# Patient Record
Sex: Female | Born: 1990 | Race: Black or African American | Hispanic: No | Marital: Married | Smoking: Former smoker
Health system: Southern US, Community
[De-identification: ages and names within clinical notes are randomized; demographics above are authoritative.]

## PROBLEM LIST (undated history)

## (undated) DIAGNOSIS — Z5189 Encounter for other specified aftercare: Secondary | ICD-10-CM

## (undated) DIAGNOSIS — Q8740 Marfan's syndrome, unspecified: Secondary | ICD-10-CM

## (undated) DIAGNOSIS — D560 Alpha thalassemia: Secondary | ICD-10-CM

## (undated) DIAGNOSIS — D6859 Other primary thrombophilia: Secondary | ICD-10-CM

## (undated) DIAGNOSIS — D571 Sickle-cell disease without crisis: Secondary | ICD-10-CM

## (undated) DIAGNOSIS — I719 Aortic aneurysm of unspecified site, without rupture: Secondary | ICD-10-CM

## (undated) DIAGNOSIS — I509 Heart failure, unspecified: Secondary | ICD-10-CM

## (undated) DIAGNOSIS — R569 Unspecified convulsions: Secondary | ICD-10-CM

## (undated) HISTORY — PX: FOOT SURGERY: SHX648

## (undated) HISTORY — PX: EXPLORATION POST OPERATIVE OPEN HEART: SHX5061

## (undated) HISTORY — PX: NECK SURGERY: SHX720

---

## 2019-10-22 ENCOUNTER — Emergency Department (HOSPITAL_COMMUNITY): Payer: Medicaid Other

## 2019-10-22 ENCOUNTER — Encounter (HOSPITAL_COMMUNITY): Payer: Self-pay | Admitting: Obstetrics and Gynecology

## 2019-10-22 ENCOUNTER — Other Ambulatory Visit: Payer: Self-pay

## 2019-10-22 ENCOUNTER — Emergency Department (HOSPITAL_COMMUNITY)
Admission: AD | Admit: 2019-10-22 | Discharge: 2019-10-22 | Disposition: A | Discharge status: Home / Self Care | Payer: Medicaid Other | Attending: Emergency Medicine | Admitting: Emergency Medicine

## 2019-10-22 DIAGNOSIS — B9689 Other specified bacterial agents as the cause of diseases classified elsewhere: Secondary | ICD-10-CM

## 2019-10-22 DIAGNOSIS — D6859 Other primary thrombophilia: Secondary | ICD-10-CM | POA: Diagnosis not present

## 2019-10-22 DIAGNOSIS — R1084 Generalized abdominal pain: Secondary | ICD-10-CM | POA: Diagnosis not present

## 2019-10-22 DIAGNOSIS — Z3A17 17 weeks gestation of pregnancy: Secondary | ICD-10-CM | POA: Diagnosis not present

## 2019-10-22 DIAGNOSIS — R079 Chest pain, unspecified: Secondary | ICD-10-CM

## 2019-10-22 DIAGNOSIS — R0789 Other chest pain: Secondary | ICD-10-CM | POA: Diagnosis not present

## 2019-10-22 DIAGNOSIS — Z79899 Other long term (current) drug therapy: Secondary | ICD-10-CM | POA: Diagnosis not present

## 2019-10-22 DIAGNOSIS — R109 Unspecified abdominal pain: Secondary | ICD-10-CM | POA: Diagnosis not present

## 2019-10-22 DIAGNOSIS — I509 Heart failure, unspecified: Secondary | ICD-10-CM | POA: Diagnosis not present

## 2019-10-22 DIAGNOSIS — N760 Acute vaginitis: Secondary | ICD-10-CM | POA: Diagnosis not present

## 2019-10-22 DIAGNOSIS — Z3A16 16 weeks gestation of pregnancy: Secondary | ICD-10-CM | POA: Diagnosis not present

## 2019-10-22 DIAGNOSIS — O26892 Other specified pregnancy related conditions, second trimester: Secondary | ICD-10-CM | POA: Diagnosis present

## 2019-10-22 DIAGNOSIS — Q8740 Marfan's syndrome, unspecified: Secondary | ICD-10-CM | POA: Insufficient documentation

## 2019-10-22 DIAGNOSIS — Z8679 Personal history of other diseases of the circulatory system: Secondary | ICD-10-CM

## 2019-10-22 HISTORY — DX: Marfan's syndrome, unspecified: Q87.40

## 2019-10-22 HISTORY — DX: Alpha thalassemia: D56.0

## 2019-10-22 HISTORY — DX: Heart failure, unspecified: I50.9

## 2019-10-22 HISTORY — DX: Aortic aneurysm of unspecified site, without rupture: I71.9

## 2019-10-22 HISTORY — DX: Other primary thrombophilia: D68.59

## 2019-10-22 LAB — BASIC METABOLIC PANEL
Anion gap: 11 (ref 5–15)
BUN: <5 mg/dL — ABNORMAL LOW (ref 6–20)
CO2: 20 mmol/L — ABNORMAL LOW (ref 22–32)
Calcium: 8.9 mg/dL (ref 8.9–10.3)
Chloride: 105 mmol/L (ref 98–111)
Creatinine, Ser: 0.53 mg/dL (ref 0.44–1.00)
GFR calc Af Amer: >60 mL/min (ref >60)
GFR calc non Af Amer: >60 mL/min (ref >60)
Glucose, Bld: 97 mg/dL (ref 70–99)
Potassium: 3.3 mmol/L — ABNORMAL LOW (ref 3.5–5.1)
Sodium: 136 mmol/L (ref 135–145)

## 2019-10-22 LAB — TROPONIN I (HIGH SENSITIVITY)
Troponin I (High Sensitivity): 5 ng/L (ref <18)
Troponin I (High Sensitivity): 7 ng/L (ref <18)

## 2019-10-22 LAB — CBC
HCT: 33.7 % — ABNORMAL LOW (ref 36.0–46.0)
Hemoglobin: 10.8 g/dL — ABNORMAL LOW (ref 12.0–15.0)
MCH: 25.6 pg — ABNORMAL LOW (ref 26.0–34.0)
MCHC: 32 g/dL (ref 30.0–36.0)
MCV: 79.9 fL — ABNORMAL LOW (ref 80.0–100.0)
Platelets: 267 10*3/uL (ref 150–400)
RBC: 4.22 MIL/uL (ref 3.87–5.11)
RDW: 15.4 % (ref 11.5–15.5)
WBC: 6.6 10*3/uL (ref 4.0–10.5)
nRBC: 0 % (ref 0.0–0.2)

## 2019-10-22 LAB — URINALYSIS, ROUTINE W REFLEX MICROSCOPIC
Bilirubin Urine: NEGATIVE
Glucose, UA: NEGATIVE mg/dL
Hgb urine dipstick: NEGATIVE
Ketones, ur: 20 mg/dL — AB
Leukocytes,Ua: NEGATIVE
Nitrite: NEGATIVE
Protein, ur: 30 mg/dL — AB
Specific Gravity, Urine: 1.02 (ref 1.005–1.030)
pH: 6 (ref 5.0–8.0)

## 2019-10-22 LAB — WET PREP, GENITAL
Sperm: NONE SEEN
Trich, Wet Prep: NONE SEEN
Yeast Wet Prep HPF POC: NONE SEEN

## 2019-10-22 LAB — HCG, QUANTITATIVE, PREGNANCY: hCG, Beta Chain, Quant, S: 21266 m[IU]/mL — ABNORMAL HIGH (ref <5)

## 2019-10-22 MED ORDER — CARVEDILOL 25 MG PO TABS: 50.0000 mg | ORAL_TABLET | Freq: Two times a day (BID) | ORAL | 0 refills | Status: AC

## 2019-10-22 MED ORDER — MORPHINE SULFATE (PF) 4 MG/ML IV SOLN
4.0000 mg | Freq: Once | INTRAVENOUS | Status: AC
  Administered 2019-10-22: 4 mg via INTRAVENOUS
  Filled 2019-10-22: qty 1

## 2019-10-22 MED ORDER — ONDANSETRON HCL 4 MG/2ML IJ SOLN
4.0000 mg | Freq: Once | INTRAMUSCULAR | Status: AC
  Administered 2019-10-22: 4 mg via INTRAVENOUS
  Filled 2019-10-22: qty 2

## 2019-10-22 MED ORDER — IOHEXOL 350 MG/ML SOLN
100.0000 mL | Freq: Once | INTRAVENOUS | Status: AC | PRN
  Administered 2019-10-22: 100 mL via INTRAVENOUS

## 2019-10-22 MED ORDER — ENOXAPARIN SODIUM 80 MG/0.8ML ~~LOC~~ SOLN: 80.0000 mg | SUBCUTANEOUS | 1 refills | Status: AC

## 2019-10-22 MED ORDER — SODIUM CHLORIDE 0.9% FLUSH
3.0000 mL | Freq: Once | INTRAVENOUS | Status: AC
  Administered 2019-10-22: 3 mL via INTRAVENOUS

## 2019-10-22 MED ORDER — ACETAMINOPHEN 500 MG PO TABS: 500.0000 mg | ORAL_TABLET | Freq: Once | ORAL | Status: DC

## 2019-10-22 MED ORDER — METRONIDAZOLE 500 MG PO TABS
500.0000 mg | ORAL_TABLET | Freq: Two times a day (BID) | ORAL | 0 refills | Status: AC
Start: 2019-10-22 — End: ?

## 2019-10-22 MED ORDER — QUETIAPINE FUMARATE 300 MG PO TABS: 300.0000 mg | ORAL_TABLET | Freq: Every day | ORAL | 0 refills | Status: AC

## 2019-10-22 NOTE — ED Provider Notes (Signed)
[facility] EMERGENCY DEPARTMENT Provider Note   CSN: [id number] Arrival date & time: 10/22/19  1115     History Chief Complaint  Patient presents with  . Abdominal Pain  . Chest Pain    Kimberly Benitez is a 29 y.o. female.  The history is provided by the patient and medical records. No language interpreter was used.  Abdominal Pain Associated symptoms: chest pain   Chest Pain Associated symptoms: abdominal pain      29 year old female with history of Marfan syndrome, aortic aneurysm, CHF, protein C deficiency presenting for evaluation of abdominal and chest pain.  Patient states she has an aortic root repair last year when she was found to have an aneurysm measuring 5.4 cm.  For the past 2-3 months she has had persistent pain to her upper abdomen that radiates towards her chest.  Pain is sharp, throbbing, worse with exertion, present at rest, with associated lightheadedness.  Pain is moderate in severity.  No associated fever chills no shortness of breath or productive cough no vaginal bleeding or vaginal discharge.  She is also [redacted] weeks pregnant.  She is a G6, P2.  She has not had a formal abdominal ultrasound but states on a pregnancy standpoint she is feeling fine.  She does endorse recurrent nausea in which she takes Phenergan and Zofran with some relief.  She mention being admitted to the hospital for her complaint several weeks ago.  States that no specific intervention were made except close monitoring.  She did not receive any CT scan due to her pregnancy.  She does endorse occasional discomfort in her left arm, most recent was yesterday but none today.  She mention she was prescribed opiate pain medication which did provide some relief but now she is out of the medication and her pain has been persistent.  Patient reports she recently moved here 2 weeks ago and have not established any care yet.  She is here at the urging of her sister.  Past Medical  History:  Diagnosis Date  . Alpha thalassemia (HCC)   . Aortic aneurysm (HCC)   . CHF (congestive heart failure) (HCC)   . Marfan syndrome   . Protein C deficiency (HCC)     There are no problems to display for this patient.   Past Surgical History:  Procedure Laterality Date  . EXPLORATION POST OPERATIVE OPEN HEART    . FOOT SURGERY    . NECK SURGERY       OB History    Gravida  5   Para  2   Term  2   Preterm      AB  2   Living  2     SAB  2   TAB      Ectopic      Multiple      Live Births              History reviewed. No pertinent family history.  Social History   Tobacco Use  . Smoking status: Former Games developer  . Smokeless tobacco: Former Engineer, water Use Topics  . Alcohol use: Not Currently  . Drug use: Not Currently    Home Medications Prior to Admission medications   Medication Sig Start Date End Date Taking? Authorizing Provider  carvedilol (COREG) 25 MG tablet Take 50 mg by mouth 2 (two) times daily with a meal.   Yes [provider]  enoxaparin (LOVENOX) 80 MG/0.8ML injection Inject 80 mg into  the skin daily.   Yes [provider]  gabapentin (NEURONTIN) 300 MG capsule Take 300 mg by mouth 3 (three) times daily.   Yes [provider]  QUEtiapine (SEROQUEL) 300 MG tablet Take 500 mg by mouth at bedtime.   Yes [provider]    Allergies    Peanut-containing drug products  Review of Systems   Review of Systems  Cardiovascular: Positive for chest pain.  Gastrointestinal: Positive for abdominal pain.  All other systems reviewed and are negative.   Physical Exam Updated Vital Signs BP 108/66 (BP Location: Left Arm)   Pulse 92   Temp 97.7 F (36.5 C) (Oral)   Resp 20   Ht 6' (1.829 m)   Wt 109.6 kg   SpO2 98%   BMI 32.77 kg/m   Physical Exam Vitals and nursing note reviewed.  Constitutional:      General: She is not in acute distress.    Appearance: She is well-developed.    HENT:     Head: Atraumatic.  Eyes:     Conjunctiva/sclera: Conjunctivae normal.  Cardiovascular:     Rate and Rhythm: Normal rate and regular rhythm.     Heart sounds: Murmur heard.   Pulmonary:     Effort: Pulmonary effort is normal.     Breath sounds: Normal breath sounds.  Abdominal:     General: Abdomen is flat. There is no abdominal bruit.     Palpations: Abdomen is soft.     Tenderness: There is no abdominal tenderness.  Musculoskeletal:     Cervical back: Neck supple.  Skin:    Findings: No rash.  Neurological:     Mental Status: She is alert and oriented to person, place, and time.  Psychiatric:        Mood and Affect: Mood normal.     ED Results / Procedures / Treatments   Labs (all labs ordered are listed, but only abnormal results are displayed) Labs Reviewed  WET PREP, GENITAL - Abnormal; Notable for the following components:      Result Value   Clue Cells Wet Prep HPF POC PRESENT (*)    WBC, Wet Prep HPF POC MANY (*)    All other components within normal limits  URINALYSIS, ROUTINE W REFLEX MICROSCOPIC - Abnormal; Notable for the following components:   Ketones, ur 20 (*)    Protein, ur 30 (*)    Bacteria, UA RARE (*)    All other components within normal limits  BASIC METABOLIC PANEL - Abnormal; Notable for the following components:   Potassium 3.3 (*)    CO2 20 (*)    BUN <5 (*)    All other components within normal limits  CBC - Abnormal; Notable for the following components:   Hemoglobin 10.8 (*)    HCT 33.7 (*)    MCV 79.9 (*)    MCH 25.6 (*)    All other components within normal limits  HCG, QUANTITATIVE, PREGNANCY - Abnormal; Notable for the following components:   hCG, Beta Chain, Quant, S 21,266 (*)    All other components within normal limits  GC/CHLAMYDIA PROBE AMP ([place]) NOT AT [facility]  TROPONIN I (HIGH SENSITIVITY)  TROPONIN I (HIGH SENSITIVITY)    EKG None  ED ECG REPORT   Date: 10/22/2019  Rate: 92  Rhythm: normal  sinus rhythm  QRS Axis: right  Intervals: normal  ST/T Wave abnormalities: nonspecific T wave changes  Conduction Disutrbances:none  Narrative Interpretation:   Old EKG  Reviewed: none available  I have personally reviewed the EKG tracing and agree with the computerized printout as noted.   Radiology DG Chest Portable 1 View  Result Date: 10/22/2019 CLINICAL DATA:  Abdominal and chest pain, history of aortic root aneurysm EXAM: PORTABLE CHEST 1 VIEW COMPARISON:  None. FINDINGS: Status post median sternotomy. Both lungs are clear. The visualized skeletal structures are unremarkable. IMPRESSION: 1. No acute abnormality of the lungs in portable projection. 2. Status post median sternotomy. Electronically Signed   By: Eddie Candle M.D.   On: 10/22/2019 13:28    Procedures Procedures (including critical care time)  Medications Ordered in ED Medications  sodium chloride flush (NS) 0.9 % injection 3 mL (3 mLs Intravenous Given 10/22/19 1251)  morphine 4 MG/ML injection 4 mg (4 mg Intravenous Given 10/22/19 1252)  ondansetron (ZOFRAN) injection 4 mg (4 mg Intravenous Given 10/22/19 1251)    ED Course  I have reviewed the triage vital signs and the nursing notes.  Pertinent labs & imaging results that were available during my care of the patient were reviewed by me and considered in my medical decision making (see chart for details).    MDM Rules/Calculators/A&P                          BP 124/84   Pulse 90   Temp 97.7 F (36.5 C) (Oral)   Resp 16   Ht 6' (1.829 m)   Wt 109.6 kg   SpO2 100%   BMI 32.77 kg/m   Final Clinical Impression(s) / ED Diagnoses Final diagnoses:  Recurrent chest pain  Generalized abdominal pain  Bacterial vaginosis    Rx / DC Orders ED Discharge Orders         Ordered    carvedilol (COREG) 25 MG tablet  2 times daily with meals     Discontinue  Reprint     10/22/19 1532    enoxaparin (LOVENOX) 80 MG/0.8ML injection  Every 24 hours     Discontinue   Reprint     10/22/19 1532    QUEtiapine (SEROQUEL) 300 MG tablet  Daily at bedtime     Discontinue  Reprint     10/22/19 1532    metroNIDAZOLE (FLAGYL) 500 MG tablet  2 times daily     Discontinue  Reprint     10/22/19 1532         12:24 PM Patient here with incidental upper abdomen and chest discomfort for the past 2 to 3 months.  History of Marfan disease and aortic root repair.  She is also [redacted] weeks pregnant.  At this time her symptoms concerning for aneurysm given her history.  Patient has equal pulses to bilateral upper extremities, no focal neuro deficit on exam, currently not in any acute distress.  Patient initially evaluated by MAU provider today for abdominal pain due to her pregnancy.  Suspect abdominal pain related to round ligament pain.  Patient will had a pelvic examination performed.  Was prescribed Flagyl for BV.  3:15 PM I have consulted radiology who recommend consulting cardiology for cardiac echo for further work up.  This can be done outpt as pt is stable and pain has been recurrently for 2-3 months.  I have consulted with oncall cardiologist who agrees and will have the office call to set up outpt f/u for echocardiogram and further work up of her aortic aneurysm.  I have request pt's record from [facility] in  [place] to be available for [place] to have access.  I haven't receive the record yet.  Will provide outpt resources for follow up.  Care discussed with Dr. Madilyn Hook  3:36 PM Pt sign out to oncoming team who will f/u pt's record from [facility] and determine disposition.     Fayrene Helper, PA-C 10/22/19 1600    Tilden Fossa, MD 10/23/19 1754

## 2019-10-22 NOTE — Discharge Instructions (Addendum)
You have been evaluated for your recurrent chest discomfort.  Due to history of aortic aneurysm, cardiology will call and set up outpatient echocardiogram.  Please call and follow up with OBGYN for further management of your pregnancy.  You should also follow-up with CT surgery.  Dr. Dian Situ recommended following up with a CT surgeon at [facility] call Dr. Fredda Hammed.  You can also contact one of our local CT surgeons, such as Dr. Cliffton Asters.   The test by OBGYN showed bacterial vaginosis and they recommend taking a course of Flagyl.  If you have any worsening of your chest pain, difficulty in breathing, abdominal pain, vaginal bleeding or other new concerning symptom, return to ER for reassessment.

## 2019-10-22 NOTE — ED Notes (Addendum)
Pt reports she is 16-[redacted] weeks pregnant, has ben having chest pain off and on her entire pregnancy, states pain got worse yesterday. Went to MAU and had EKG and was sent here for further eval. Reports hx of open heart surgery last year. resp e/u, nad. ekg done in [facility].

## 2019-10-22 NOTE — MAU Note (Signed)
Just recently moved here from [facility].  Has been having horrible chest pain  (was admitted over Mother's Day for 3 wks for same thing) and stomach pains.  Had open heart surgery last year, aortic aneurysm, 5.4cm dilated. Called phys in [facility], was recommended to come here.

## 2019-10-22 NOTE — ED Notes (Signed)
Patient transported to CT 

## 2019-10-22 NOTE — ED Provider Notes (Addendum)
Update note  29 y/o lady G6P2 at [redacted] weeks gestation. Complex PMH. Marfan syndrome, aortic aneurysm s/p repair. Has chronic chest pain, has been weaning off narcotics during pregnancy. All care has previously been done at [facility] in [place], [place]. Recently moved to [facility]. Had episode of severe chest pain last night, different than regular chest pain though now her chest pain is more similar to her chronic pain. Reports earlier her HR got to 120s at rest. Currently well appearing with stable vitals. I reviewed case in detail with her primary CT surgeron, Dr. Larena Glassman at [facility]. He states she is at high risk during pregnancy for developing a dissection and strongly recommends getting a CTA to r/o dissection.  If CTA is negative, can dc home. If pt stays in  area, recommends patient reach out to Dr. Myrle Sheng for long term CT surgery care. I reviewed with Dr. Eppie Gibson with radiology. He recommends obtaining only CTA chest w/wo, doesn't need abd/pelv for making initial dx.  Both radiology, CT surgery and myself in agreement that the benefits of this diagnostic study far outweigh risks of radiation in pregnancy. Discussed risks/benefits, recommendations from CTS, patient demonstrates understanding and wishes to proceed with CT imaging.   Plan:  CTA Aorta Chest w/wo to r/o dissection     ADDENDUM 6:41 PM  CTA Chest is negative, patient remains well appearing with stable vitals. Reviewed follow up plans and return precautions.   After the discussed management above, the patient was determined to be safe for discharge.  The patient was in agreement with this plan and all questions regarding their care were answered.  ED return precautions were discussed and the patient will return to the ED with any significant worsening of condition.     Kimberly Loll, MD 10/22/19 [phone]

## 2019-10-22 NOTE — MAU Provider Note (Addendum)
History     CSN: [id number]  Arrival date and time: 10/22/19 1115  Kimberly Benitez is a 29 yo G1P0 at 74.5 EGA who recently moved here from [place]. She is presenting to MAU c/o horrible chest pain and stomach pains  Chief Complaint  Patient presents with  . Abdominal Pain  . Chest Pain   Abdominal Pain This is a new problem. The current episode started in the past 7 days. The onset quality is gradual. The problem occurs constantly. The problem has been gradually worsening. Pain location: inguinal region. The pain is at a severity of 3/10. The pain is moderate. The quality of the pain is dull and aching. The abdominal pain does not radiate. Associated symptoms include nausea and vomiting. Pertinent negatives include no anorexia, belching, constipation, diarrhea, dysuria, fever, flatus, frequency, headaches, hematochezia or hematuria. Nothing aggravates the pain. The pain is relieved by nothing. She has tried nothing for the symptoms. clotting disorder  Chest Pain  This is a recurrent problem. The current episode started more than 1 month ago. The onset quality is undetermined. The problem occurs constantly. The problem has been gradually worsening. The pain is present in the substernal region. The pain is at a severity of 8/10. The pain is severe. The quality of the pain is described as crushing, pressure and tightness. The pain does not radiate. Associated symptoms include abdominal pain, nausea, shortness of breath and vomiting. Pertinent negatives include no diaphoresis, fever, headaches or palpitations. The pain is aggravated by deep breathing, food and coughing. She has tried nothing (Recently admitted to med [facility] in [facility] 4/31-5/12, 2021 for zofran pump, morphine drip- discharged on carvedilol 50 mg BID and Oxycodone IR 20 mg) for the symptoms. The treatment provided no relief. Risk factors: pregnant, Marfan syndrome, Recent Aortic Aneurysm Repair in Feb 2020.  Her  past medical history is significant for aortic aneurysm and Marfan's syndrome. Past medical history comments: clotting disorder  Her family medical history is significant for aortic dissection and Marfan's syndrome.     OB History    Gravida  5   Para  2   Term  2   Preterm      AB  2   Living  2     SAB  2   TAB      Ectopic      Multiple      Live Births              Past Medical History:  Diagnosis Date  . Alpha thalassemia (HCC)   . Aortic aneurysm (HCC)   . CHF (congestive heart failure) (HCC)   . Marfan syndrome   . Protein C deficiency [facility])     Past Surgical History:  Procedure Laterality Date  . EXPLORATION POST OPERATIVE OPEN HEART    . FOOT SURGERY    . NECK SURGERY      History reviewed. No pertinent family history.  Social History   Tobacco Use  . Smoking status: Former Games developer  . Smokeless tobacco: Former Engineer, water Use Topics  . Alcohol use: Not Currently  . Drug use: Not Currently    Allergies:  Allergies  Allergen Reactions  . Peanut-Containing Drug Products     (Not in a hospital admission)   Review of Systems  Constitutional: Negative for activity change, appetite change, chills, diaphoresis and fever.  HENT: Negative.   Eyes: Negative.   Respiratory: Positive for chest tightness and shortness of breath. Negative for  wheezing and stridor.   Cardiovascular: Positive for chest pain. Negative for palpitations.  Gastrointestinal: Positive for abdominal pain, nausea and vomiting. Negative for anorexia, constipation, diarrhea, flatus and hematochezia.  Endocrine: Negative.   Genitourinary: Negative for dysuria, frequency, hematuria, urgency, vaginal bleeding and vaginal discharge.       Vaginal itching  Musculoskeletal: Negative.   Skin: Negative.   Allergic/Immunologic: Negative.   Neurological: Negative for headaches.  Hematological:       H/o clotting disorder  Psychiatric/Behavioral: Negative.    Physical  Exam   Blood pressure 108/66, pulse 92, temperature 97.7 F (36.5 C), temperature source Oral, resp. rate 20, height 6' (1.829 m), weight 109.6 kg, SpO2 98 %.  Physical Exam Vitals and nursing note reviewed.  Constitutional:      Appearance: She is well-developed and normal weight.  HENT:     Head: Normocephalic and atraumatic.     Mouth/Throat:     Mouth: Mucous membranes are moist.     Pharynx: Oropharynx is clear.  Eyes:     Extraocular Movements: Extraocular movements intact.     Pupils: Pupils are equal, round, and reactive to light.     Comments: Exotropia noted  Cardiovascular:     Rate and Rhythm: Normal rate and regular rhythm.  Pulmonary:     Effort: Pulmonary effort is normal.     Breath sounds: Normal breath sounds.  Abdominal:     General: Bowel sounds are normal. There is no distension or abdominal bruit. There are no signs of injury.     Palpations: Abdomen is soft.     Tenderness: There is no abdominal tenderness.     Comments: gravid  Genitourinary:    Vagina: No foreign body. Vaginal discharge (white, thin) present. No erythema or bleeding.     Cervix: No cervical motion tenderness or friability.     Uterus: Normal.      Adnexa: Right adnexa normal and left adnexa normal.  Skin:    General: Skin is warm and dry.  Neurological:     General: No focal deficit present.     Mental Status: She is alert.  Psychiatric:        Mood and Affect: Mood normal.        Behavior: Behavior normal.     MAU Course  Procedures  MDM - Abdominal pain most likely secondary to round ligament pain - Vital signs stable - Fetal Heart Tones 150 - GC/CT and wet prep taken due to complaint of vaginal irritation on ROS  Results for orders placed or performed during the hospital encounter of 10/22/19 (from the past 24 hour(s))  Urinalysis, Routine w reflex microscopic     Status: Abnormal   Collection Time: 10/22/19 10:44 AM  Result Value Ref Range   Color, Urine YELLOW  YELLOW   APPearance CLEAR CLEAR   Specific Gravity, Urine 1.020 1.005 - 1.030   pH 6.0 5.0 - 8.0   Glucose, UA NEGATIVE NEGATIVE mg/dL   Hgb urine dipstick NEGATIVE NEGATIVE   Bilirubin Urine NEGATIVE NEGATIVE   Ketones, ur 20 (A) NEGATIVE mg/dL   Protein, ur 30 (A) NEGATIVE mg/dL   Nitrite NEGATIVE NEGATIVE   Leukocytes,Ua NEGATIVE NEGATIVE   RBC / HPF 0-5 0 - 5 RBC/hpf   WBC, UA 0-5 0 - 5 WBC/hpf   Bacteria, UA RARE (A) NONE SEEN   Squamous Epithelial / LPF 0-5 0 - 5   Mucus PRESENT   Wet prep, genital     Status:  Abnormal   Collection Time: 10/22/19 11:00 AM  Result Value Ref Range   Yeast Wet Prep HPF POC NONE SEEN NONE SEEN   Trich, Wet Prep NONE SEEN NONE SEEN   Clue Cells Wet Prep HPF POC PRESENT (A) NONE SEEN   WBC, Wet Prep HPF POC MANY (A) NONE SEEN   Sperm NONE SEEN      Assessment and Plan  29 yo [id number] at 88. 5 EGA with round ligament pain and chest pain in the context of previous open heart surgery to repair a 5.4 cm aortic aneurysm - Report called to Dr. Lockie Mola - Transfer to the [facility] in stable condition - Recommend Flagyl 500 mg PO x7 days for BV on Wet Prep - Information given to follow up at [facility]  Kimberly Benitez L Kimberly Benitez 10/22/2019, 11:22 AM

## 2019-10-23 LAB — GC/CHLAMYDIA PROBE AMP (~~LOC~~) NOT AT ARMC
Chlamydia: NEGATIVE
Comment: NEGATIVE
Comment: NORMAL
Neisseria Gonorrhea: NEGATIVE

## 2019-10-24 ENCOUNTER — Telehealth: Payer: Self-pay | Admitting: Obstetrics and Gynecology

## 2019-10-24 ENCOUNTER — Telehealth (INDEPENDENT_AMBULATORY_CARE_PROVIDER_SITE_OTHER): Payer: Medicaid Other | Admitting: General Practice

## 2019-10-24 DIAGNOSIS — O0990 Supervision of high risk pregnancy, unspecified, unspecified trimester: Secondary | ICD-10-CM

## 2019-10-24 NOTE — Telephone Encounter (Signed)
OB Telephone Note  Before setting patient up in our clinic, I wanted to touch base with CT surgery to make sure they felt comfortable with her being in our care, complications with pregnancy and need for urgent care after review of her chart. I spoke with Dr. Vickey Sages and he felt she would be better served at a tertiary care center. The ED note recommend [facility] but that's an hour away from the patient. I spoke to her and she also felt that was too far away and preferred [facility]. We will work on her setting up care with MFM.  She states that her chest pain is stable and is what happens when she gets worked up. I told her that based on her s/s that she's telling me I have to recommend she go to the ED to ensure that everything is okay, but she declines this. At the very least, I recommended she call her [facility] clinic to have them phone triage her and see what they recommend. Pt amenable to plan.  Cornelia Copa MD Attending Center for [employer] (Faculty Practice) 10/24/2019 Time: [phone]

## 2019-10-25 ENCOUNTER — Encounter: Payer: Self-pay | Admitting: Advanced Practice Midwife

## 2019-10-25 DIAGNOSIS — D6859 Other primary thrombophilia: Secondary | ICD-10-CM | POA: Insufficient documentation

## 2019-10-25 DIAGNOSIS — Z8679 Personal history of other diseases of the circulatory system: Secondary | ICD-10-CM

## 2019-10-25 DIAGNOSIS — Q8740 Marfan's syndrome, unspecified: Secondary | ICD-10-CM | POA: Insufficient documentation

## 2019-10-25 HISTORY — DX: Personal history of other diseases of the circulatory system: Z86.79

## 2019-10-25 NOTE — Telephone Encounter (Signed)
Informed by Dr Vergie Living patient needs referral to MFM with [facility] care due to high risk pregnancy. Patient has history of marfans syndrome, AAA with repair, & was admitted to the hospital for the majority of her last pregnancy. Called [facility] who states they will review the patient's records and will then call her with an appt. Called & informed patient. Patient verbalized understanding & states she is actually on the way to [facility] now because she is having chest pain again. Patient had no questions.

## 2019-12-20 ENCOUNTER — Encounter (HOSPITAL_COMMUNITY): Payer: Self-pay | Admitting: Obstetrics and Gynecology

## 2019-12-20 ENCOUNTER — Inpatient Hospital Stay (HOSPITAL_COMMUNITY)
Admission: EM | Admit: 2019-12-20 | Discharge: 2019-12-22 | DRG: 831 | Disposition: A | Discharge status: Home / Self Care | Payer: Medicaid Other | Attending: Family Medicine | Admitting: Family Medicine

## 2019-12-20 ENCOUNTER — Inpatient Hospital Stay (HOSPITAL_BASED_OUTPATIENT_CLINIC_OR_DEPARTMENT_OTHER): Payer: Medicaid Other

## 2019-12-20 ENCOUNTER — Inpatient Hospital Stay (HOSPITAL_COMMUNITY): Payer: Medicaid Other

## 2019-12-20 DIAGNOSIS — Z87891 Personal history of nicotine dependence: Secondary | ICD-10-CM

## 2019-12-20 DIAGNOSIS — T1490XA Injury, unspecified, initial encounter: Secondary | ICD-10-CM | POA: Diagnosis not present

## 2019-12-20 DIAGNOSIS — G894 Chronic pain syndrome: Secondary | ICD-10-CM

## 2019-12-20 DIAGNOSIS — N939 Abnormal uterine and vaginal bleeding, unspecified: Secondary | ICD-10-CM

## 2019-12-20 DIAGNOSIS — Z20822 Contact with and (suspected) exposure to covid-19: Secondary | ICD-10-CM | POA: Diagnosis present

## 2019-12-20 DIAGNOSIS — O26892 Other specified pregnancy related conditions, second trimester: Secondary | ICD-10-CM | POA: Diagnosis present

## 2019-12-20 DIAGNOSIS — O99019 Anemia complicating pregnancy, unspecified trimester: Secondary | ICD-10-CM | POA: Insufficient documentation

## 2019-12-20 DIAGNOSIS — W19XXXA Unspecified fall, initial encounter: Secondary | ICD-10-CM

## 2019-12-20 DIAGNOSIS — D571 Sickle-cell disease without crisis: Secondary | ICD-10-CM | POA: Insufficient documentation

## 2019-12-20 DIAGNOSIS — O99012 Anemia complicating pregnancy, second trimester: Principal | ICD-10-CM | POA: Diagnosis present

## 2019-12-20 DIAGNOSIS — D5700 Hb-SS disease with crisis, unspecified: Secondary | ICD-10-CM | POA: Diagnosis present

## 2019-12-20 DIAGNOSIS — Z3A25 25 weeks gestation of pregnancy: Secondary | ICD-10-CM

## 2019-12-20 DIAGNOSIS — R079 Chest pain, unspecified: Secondary | ICD-10-CM

## 2019-12-20 DIAGNOSIS — O9A212 Injury, poisoning and certain other consequences of external causes complicating pregnancy, second trimester: Secondary | ICD-10-CM | POA: Diagnosis not present

## 2019-12-20 DIAGNOSIS — Q8740 Marfan's syndrome, unspecified: Secondary | ICD-10-CM

## 2019-12-20 HISTORY — DX: Sickle-cell disease without crisis: D57.1

## 2019-12-20 HISTORY — DX: Unspecified convulsions: R56.9

## 2019-12-20 HISTORY — DX: Encounter for other specified aftercare: Z51.89

## 2019-12-20 LAB — TYPE AND SCREEN
ABO/RH(D): A POS
Antibody Screen: NEGATIVE

## 2019-12-20 LAB — COMPREHENSIVE METABOLIC PANEL
ALT: 11 U/L (ref 0–44)
AST: 11 U/L — ABNORMAL LOW (ref 15–41)
Albumin: 2.6 g/dL — ABNORMAL LOW (ref 3.5–5.0)
Alkaline Phosphatase: 59 U/L (ref 38–126)
Anion gap: 8 (ref 5–15)
BUN: 5 mg/dL — ABNORMAL LOW (ref 6–20)
CO2: 22 mmol/L (ref 22–32)
Calcium: 8.6 mg/dL — ABNORMAL LOW (ref 8.9–10.3)
Chloride: 108 mmol/L (ref 98–111)
Creatinine, Ser: 0.6 mg/dL (ref 0.44–1.00)
GFR calc Af Amer: >60 mL/min (ref >60)
GFR calc non Af Amer: >60 mL/min (ref >60)
Glucose, Bld: 93 mg/dL (ref 70–99)
Potassium: 3.7 mmol/L (ref 3.5–5.1)
Sodium: 138 mmol/L (ref 135–145)
Total Bilirubin: 0.2 mg/dL — ABNORMAL LOW (ref 0.3–1.2)
Total Protein: 6.2 g/dL — ABNORMAL LOW (ref 6.5–8.1)

## 2019-12-20 LAB — CBC WITH DIFFERENTIAL/PLATELET
Abs Immature Granulocytes: 0.02 10*3/uL (ref 0.00–0.07)
Basophils Absolute: 0 10*3/uL (ref 0.0–0.1)
Basophils Relative: 0 %
Eosinophils Absolute: 0.1 10*3/uL (ref 0.0–0.5)
Eosinophils Relative: 1 %
HCT: 31.6 % — ABNORMAL LOW (ref 36.0–46.0)
Hemoglobin: 10 g/dL — ABNORMAL LOW (ref 12.0–15.0)
Immature Granulocytes: 0 %
Lymphocytes Relative: 30 %
Lymphs Abs: 1.9 10*3/uL (ref 0.7–4.0)
MCH: 26 pg (ref 26.0–34.0)
MCHC: 31.6 g/dL (ref 30.0–36.0)
MCV: 82.1 fL (ref 80.0–100.0)
Monocytes Absolute: 0.3 10*3/uL (ref 0.1–1.0)
Monocytes Relative: 5 %
Neutro Abs: 4.1 10*3/uL (ref 1.7–7.7)
Neutrophils Relative %: 64 %
Platelets: 214 10*3/uL (ref 150–400)
RBC: 3.85 MIL/uL — ABNORMAL LOW (ref 3.87–5.11)
RDW: 14.8 % (ref 11.5–15.5)
WBC: 6.4 10*3/uL (ref 4.0–10.5)
nRBC: 0 % (ref 0.0–0.2)

## 2019-12-20 LAB — RETICULOCYTES
Immature Retic Fract: 19.2 % — ABNORMAL HIGH (ref 2.3–15.9)
RBC.: 3.9 MIL/uL (ref 3.87–5.11)
Retic Count, Absolute: 65.5 10*3/uL (ref 19.0–186.0)
Retic Ct Pct: 1.7 % (ref 0.4–3.1)

## 2019-12-20 LAB — SARS CORONAVIRUS 2 BY RT PCR (HOSPITAL ORDER, PERFORMED IN ~~LOC~~ HOSPITAL LAB): SARS Coronavirus 2: NEGATIVE

## 2019-12-20 MED ORDER — OXYCODONE-ACETAMINOPHEN 5-325 MG PO TABS
1.0000 | ORAL_TABLET | Freq: Four times a day (QID) | ORAL | Status: DC | PRN
  Administered 2019-12-20: 2 via ORAL
  Filled 2019-12-20: qty 2

## 2019-12-20 MED ORDER — QUETIAPINE FUMARATE 300 MG PO TABS: 300.0000 mg | ORAL_TABLET | Freq: Every day | ORAL | Status: DC

## 2019-12-20 MED ORDER — CALCIUM CARBONATE ANTACID 500 MG PO CHEW: 2.0000 | CHEWABLE_TABLET | ORAL | Status: DC | PRN

## 2019-12-20 MED ORDER — OXYCODONE HCL ER 20 MG PO T12A: 20.0000 mg | EXTENDED_RELEASE_TABLET | Freq: Two times a day (BID) | ORAL | Status: DC

## 2019-12-20 MED ORDER — GABAPENTIN 300 MG PO CAPS
300.0000 mg | ORAL_CAPSULE | Freq: Three times a day (TID) | ORAL | Status: DC
  Administered 2019-12-20 – 2019-12-22 (×4): 300 mg via ORAL
  Filled 2019-12-20 (×5): qty 1

## 2019-12-20 MED ORDER — PRENATAL MULTIVITAMIN CH: 1.0000 | ORAL_TABLET | Freq: Every day | ORAL | Status: DC

## 2019-12-20 MED ORDER — HYDROXYZINE HCL 50 MG PO TABS
25.0000 mg | ORAL_TABLET | Freq: Three times a day (TID) | ORAL | Status: DC | PRN
  Filled 2019-12-20: qty 1

## 2019-12-20 MED ORDER — FERROUS SULFATE 325 (65 FE) MG PO TABS
325.0000 mg | ORAL_TABLET | Freq: Three times a day (TID) | ORAL | Status: DC
  Administered 2019-12-21 – 2019-12-22 (×3): 325 mg via ORAL
  Filled 2019-12-20 (×3): qty 1

## 2019-12-20 MED ORDER — ACETAMINOPHEN 325 MG PO TABS
650.0000 mg | ORAL_TABLET | ORAL | Status: DC | PRN
  Administered 2019-12-20: 650 mg via ORAL
  Filled 2019-12-20: qty 2

## 2019-12-20 MED ORDER — ZOLPIDEM TARTRATE 5 MG PO TABS
5.0000 mg | ORAL_TABLET | Freq: Every evening | ORAL | Status: DC | PRN
  Administered 2019-12-20: 5 mg via ORAL
  Filled 2019-12-20: qty 1

## 2019-12-20 MED ORDER — DOCUSATE SODIUM 100 MG PO CAPS
100.0000 mg | ORAL_CAPSULE | Freq: Every day | ORAL | Status: DC
  Administered 2019-12-22: 100 mg via ORAL
  Filled 2019-12-20: qty 1

## 2019-12-20 MED ORDER — ENOXAPARIN SODIUM 80 MG/0.8ML ~~LOC~~ SOLN
80.0000 mg | SUBCUTANEOUS | Status: DC
  Administered 2019-12-20 – 2019-12-21 (×2): 80 mg via SUBCUTANEOUS
  Filled 2019-12-20 (×2): qty 0.8

## 2019-12-20 MED ORDER — LACTATED RINGERS IV SOLN: INTRAVENOUS | Status: DC

## 2019-12-20 MED ORDER — QUETIAPINE FUMARATE ER 400 MG PO TB24
800.0000 mg | ORAL_TABLET | Freq: Every day | ORAL | Status: DC
  Administered 2019-12-20 – 2019-12-21 (×2): 800 mg via ORAL
  Filled 2019-12-20 (×2): qty 2

## 2019-12-20 MED ORDER — PRENATAL MULTIVITAMIN CH
1.0000 | ORAL_TABLET | Freq: Every day | ORAL | Status: DC
  Administered 2019-12-21: 1 via ORAL
  Filled 2019-12-20: qty 1

## 2019-12-20 MED ORDER — ONDANSETRON HCL 4 MG/2ML IJ SOLN
4.0000 mg | Freq: Once | INTRAMUSCULAR | Status: AC
  Administered 2019-12-20: 4 mg via INTRAVENOUS
  Filled 2019-12-20: qty 2

## 2019-12-20 MED ORDER — HYDROMORPHONE HCL 1 MG/ML IJ SOLN
2.0000 mg | Freq: Once | INTRAMUSCULAR | Status: AC
  Administered 2019-12-20: 2 mg via INTRAVENOUS
  Filled 2019-12-20: qty 2

## 2019-12-20 NOTE — MAU Provider Note (Signed)
History     CSN: [id number]  Arrival date and time: 12/20/19 1300   First Provider Initiated Contact with Patient 12/20/19 1439      Chief Complaint  Patient presents with   Fall   HPI  Ms. Kimberly Benitez is a 29 y.o. [id number] at [redacted]w[redacted]d who presents to MAU today with complaint of chest pain, leg pain and a fall earlier today in the shower after a syncopal episode. 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 She says that earlier today she was in the shower and felt faint, her husband tried to catch her but she fell and hit her abdomen on the left side on the side of the tub. When she got up she went to the bathroom and saw a spot of pink when wiping. She states N/V today and pain that feels like sickle cell pain but is increasing. She sees Dr. Konrad Felix at [facility] for pain management. Her pain is being controlled with injections during her pregnancy as he did not want to continue PO narcotics. She denies contractions and states normal fetal movement.    OB History    Gravida  5   Para  2   Term  2   Preterm      AB  2   Living  2     SAB  2   TAB      Ectopic      Multiple      Live Births              Past Medical History:  Diagnosis Date   Alpha thalassemia (HCC)    Aortic aneurysm (HCC)    Blood transfusion without reported diagnosis    CHF (congestive heart failure) (HCC)    History of aortic aneurysm 10/25/2019   Marfan syndrome    Protein C deficiency (HCC)    Seizures (HCC)    Sickle cell anemia (HCC)     Past Surgical History:  Procedure Laterality Date   EXPLORATION POST OPERATIVE OPEN HEART     FOOT SURGERY     NECK SURGERY      Family History  Problem Relation Age of Onset   Diabetes Mother    Cancer Father     Social History   Tobacco Use   Smoking status: Former Smoker   Smokeless tobacco: Former Film/video editor Use: Former  Substance Use Topics   Alcohol use: Not Currently   Drug use: Not Currently    Allergies:  Allergies  Allergen Reactions   Peanut-Containing Drug Products     Medications Prior to Admission  Medication Sig Dispense Refill Last Dose   cholecalciferol (VITAMIN D3) 25 MCG (1000 UNIT) tablet Take 1,000 Units by mouth daily.   12/19/2019 at 2045   enoxaparin (LOVENOX) 80 MG/0.8ML injection Inject 0.8 mLs (80 mg total) into the skin daily. 8 mL 1 12/19/2019 at 2045   ferrous sulfate 325 (65 FE) MG tablet Take 325 mg by mouth 3 (three) times daily with meals.   12/19/2019 at 1100   gabapentin (NEURONTIN) 300 MG capsule Take 300 mg by mouth 3 (three) times daily.   12/19/2019 at 2045   Prenatal Vit-Fe Fumarate-FA (PRENATAL MULTIVITAMIN) TABS tablet Take 1 tablet by mouth daily at 12 noon.   12/20/2019 at 1100   QUEtiapine (SEROQUEL) 300 MG tablet Take 1 tablet (300 mg total) by  mouth at bedtime. Taking with 2 (100mg ) tabs = 500mg  at bedtime. 30 tablet 0 12/19/2019 at 2045   carvedilol (COREG) 25 MG tablet Take 2 tablets (50 mg total) by mouth 2 (two) times daily with a meal. 30 tablet 0    metroNIDAZOLE (FLAGYL) 500 MG tablet Take 1 tablet (500 mg total) by mouth 2 (two) times daily. One po bid x 7 days 14 tablet 0    QUEtiapine (SEROQUEL) 100 MG tablet Take 200 mg by mouth at bedtime. Taking with 300mg  = 500mg  at bedttime       Review of Systems  Constitutional: Negative for fever.  Cardiovascular: Positive for chest pain.  Gastrointestinal: Positive for nausea and vomiting. Negative for abdominal pain.  Genitourinary: Positive for vaginal bleeding. Negative for vaginal discharge.  Musculoskeletal: Positive for myalgias.   Physical Exam   Blood pressure 126/73, pulse 77, temperature 98.4 F (36.9 C), temperature source Oral, resp. rate 20, height 6' (1.829 m), weight 114.3 kg, SpO2 100 %.  Physical Exam Vitals and nursing note reviewed.  Constitutional:       General: She is not in acute distress.    Appearance: She is well-developed.  HENT:     Head: Normocephalic and atraumatic.  Cardiovascular:     Rate and Rhythm: Normal rate and regular rhythm.     Heart sounds: No murmur heard.   Pulmonary:     Effort: Pulmonary effort is normal.  Abdominal:     General: There is no distension.     Palpations: Abdomen is soft. There is no mass.     Tenderness: There is no abdominal tenderness. There is no guarding or rebound.  Skin:    General: Skin is warm and dry.     Findings: No erythema.  Neurological:     Mental Status: She is alert and oriented to person, place, and time.      Results for orders placed or performed during the hospital encounter of 12/20/19 (from the past 24 hour(s))  Type and screen [facility]     Status: None   Collection Time: 12/20/19  3:15 PM  Result Value Ref Range   ABO/RH(D) A POS    Antibody Screen NEG    Sample Expiration      12/23/2019,2359 Performed at [facility] Lab, [address], [place], 12/25/2019 [facility]   CBC with Differential/Platelet     Status: Abnormal   Collection Time: 12/20/19  3:28 PM  Result Value Ref Range   WBC 6.4 4.0 - 10.5 K/uL   RBC 3.85 (L) 3.87 - 5.11 MIL/uL   Hemoglobin 10.0 (L) 12.0 - 15.0 g/dL   HCT [place] (L) 36 - 46 %   MCV 82.1 80.0 - 100.0 fL   MCH 26.0 26.0 - 34.0 pg   MCHC 31.6 30.0 - 36.0 g/dL   RDW [place] [address] - 12/22/19 %   Platelets 214 150 - 400 K/uL   nRBC 0.0 0.0 - 0.2 %   Neutrophils Relative % 64 %   Neutro Abs 4.1 1.7 - 7.7 K/uL   Lymphocytes Relative 30 %   Lymphs Abs 1.9 0.7 - 4.0 K/uL   Monocytes Relative 5 %   Monocytes Absolute 0.3 0 - 1 K/uL   Eosinophils Relative 1 %   Eosinophils Absolute 0.1 0 - 0 K/uL   Basophils Relative 0 %   Basophils Absolute 0.0 0 - 0 K/uL   Immature Granulocytes 0 %   Abs Immature Granulocytes  0.02 0.00 - 0.07 K/uL  Comprehensive metabolic panel     Status: Abnormal   Collection Time: 12/20/19  3:28  PM  Result Value Ref Range   Sodium 138 135 - 145 mmol/L   Potassium 3.7 3.5 - 5.1 mmol/L   Chloride 108 98 - 111 mmol/L   CO2 22 22 - 32 mmol/L   Glucose, Bld 93 70 - 99 mg/dL   BUN 5 (L) 6 - 20 mg/dL   Creatinine, Ser 1.61 0.44 - 1.00 mg/dL   Calcium 8.6 (L) 8.9 - 10.3 mg/dL   Total Protein 6.2 (L) 6.5 - 8.1 g/dL   Albumin 2.6 (L) 3.5 - 5.0 g/dL   AST 11 (L) 15 - 41 U/L   ALT 11 0 - 44 U/L   Alkaline Phosphatase 59 38 - 126 U/L   Total Bilirubin 0.2 (L) 0.3 - 1.2 mg/dL   GFR calc non Af Amer >60 >60 mL/min   GFR calc Af Amer >60 >60 mL/min   Anion gap 8 5 - 15  Reticulocytes     Status: Abnormal   Collection Time: 12/20/19  3:28 PM  Result Value Ref Range   Retic Ct Pct 1.7 0.4 - 3.1 %   RBC. 3.90 3.87 - 5.11 MIL/uL   Retic Count, Absolute 65.5 19.0 - 186.0 K/uL   Immature Retic Fract 19.2 (H) 2.3 - 15.9 %   DG CHEST PORT 1 VIEW  Result Date: 12/20/2019 CLINICAL DATA:  Fall.  Patient is pregnant EXAM: PORTABLE CHEST 1 VIEW COMPARISON:  10/22/2019 FINDINGS: Prior median sternotomy. Heart size is mildly enlarged. No focal airspace consolidation, pleural effusion, or pneumothorax. Lower cervical ACDF. IMPRESSION: No active disease. Electronically Signed   By: Duanne Guess D.O.   On: 12/20/2019 16:03   Fetal Monitoring: Baseline: 140 bpm Variability: minimal Accelerations: few 10 x 10 Decelerations: none Contractions: none   MAU Course  Procedures None  MDM Discussed patient with Dr. Earlene Plater. 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

## 2019-12-20 NOTE — ED Triage Notes (Signed)
Emergency Medicine Provider OB Triage Evaluation Note  Kimberly Benitez is a 29 y.o. female, [id number], at [redacted]w[redacted]d gestation who presents to the emergency department with complaints of vaginal bleeding in pregnancy.  She gets all of her care through week primarily however was in [place] today.  She has chronic pain in her chest.  She has prior aortic repair.  She has multiple recent admissions and ER visits for various complaints.  She reports that she has chronic pain in her chest along with pain in the bilateral arms and legs.  She states that she fell today.  She did not strike her head.  She states that she has a low-lying placenta and had some vaginal spotting without frank bleeding.  No loss of fluids.  Review of  Systems  Positive: Chest pain, shortness of breath, sickle cell, pain in arms/legs,  Negative: headache, fever  Physical Exam  BP 114/73 (BP Location: Right Arm)   Pulse (!) 108   Temp 98.4 F (36.9 C) (Oral)   Resp 18   Ht 6' (1.829 m)   Wt 114.3 kg   SpO2 98%   BMI 34.18 kg/m  General: Awake, no distress, marfanoid body habitus HEENT: Atraumatic  Resp: Normal effort  Cardiac: Tachycardic Abd: Nondistended, nontender  MSK: Moves all extremities without difficulty Neuro: Speech clear, disconjugate gaze baseline  Medical Decision Making  Pt evaluated for pregnancy concern and is stable for transfer to MAU. Pt is in agreement with plan for transfer.  1:30 PM Discussed with MAU APP, Raynelle Fanning, who accepts patient in transfer.  Clinical Impression  No diagnosis found.     Cristina Gong, [place] 12/20/19 1342

## 2019-12-20 NOTE — ED Triage Notes (Signed)
Pt reports BLE pain, Chest pain and SOB x2 days

## 2019-12-20 NOTE — MAU Note (Signed)
Bedside x-ray being done, patient might be off monitor for few minutes

## 2019-12-20 NOTE — ED Notes (Signed)
Disregard vitals, sort nurse tech already documented vitals

## 2019-12-20 NOTE — MAU Note (Signed)
.. °  Kimberly Benitez is a 29 y.o. at [redacted]w[redacted]d here in MAU reporting: she was sent from ED for a fall that happened after 1230 today. Pt denies any vaginal bleeding at present. Had small amount after the fall. Pt has a low lying placenta per pt. Pain in her lower back and left lower side  Onset of complaint: 1230 today Pain score: 8 Vitals:   12/20/19 1308 12/20/19 1414  BP: 114/73 (!) 122/92  Pulse: (!) 108 83  Resp: 18 18  Temp: 98.4 F (36.9 C) 97.8 F (36.6 C)  SpO2: 98% 100%     FHT:150 Lab orders placed from triage:

## 2019-12-21 DIAGNOSIS — Z87891 Personal history of nicotine dependence: Secondary | ICD-10-CM | POA: Diagnosis not present

## 2019-12-21 DIAGNOSIS — Q8740 Marfan's syndrome, unspecified: Secondary | ICD-10-CM | POA: Diagnosis not present

## 2019-12-21 DIAGNOSIS — G894 Chronic pain syndrome: Secondary | ICD-10-CM | POA: Diagnosis present

## 2019-12-21 DIAGNOSIS — D5700 Hb-SS disease with crisis, unspecified: Secondary | ICD-10-CM | POA: Diagnosis present

## 2019-12-21 DIAGNOSIS — D571 Sickle-cell disease without crisis: Secondary | ICD-10-CM

## 2019-12-21 DIAGNOSIS — O99891 Other specified diseases and conditions complicating pregnancy: Secondary | ICD-10-CM | POA: Diagnosis not present

## 2019-12-21 DIAGNOSIS — O99019 Anemia complicating pregnancy, unspecified trimester: Secondary | ICD-10-CM | POA: Diagnosis not present

## 2019-12-21 DIAGNOSIS — Z3A25 25 weeks gestation of pregnancy: Secondary | ICD-10-CM | POA: Diagnosis not present

## 2019-12-21 DIAGNOSIS — O26892 Other specified pregnancy related conditions, second trimester: Secondary | ICD-10-CM | POA: Diagnosis present

## 2019-12-21 DIAGNOSIS — Z20822 Contact with and (suspected) exposure to covid-19: Secondary | ICD-10-CM | POA: Diagnosis present

## 2019-12-21 DIAGNOSIS — O99012 Anemia complicating pregnancy, second trimester: Secondary | ICD-10-CM | POA: Diagnosis not present

## 2019-12-21 LAB — URINALYSIS, ROUTINE W REFLEX MICROSCOPIC
Bilirubin Urine: NEGATIVE
Glucose, UA: NEGATIVE mg/dL
Hgb urine dipstick: NEGATIVE
Ketones, ur: NEGATIVE mg/dL
Leukocytes,Ua: NEGATIVE
Nitrite: NEGATIVE
Protein, ur: NEGATIVE mg/dL
Specific Gravity, Urine: 1.014 (ref 1.005–1.030)
pH: 7 (ref 5.0–8.0)

## 2019-12-21 LAB — RETICULOCYTES
Immature Retic Fract: 18.5 % — ABNORMAL HIGH (ref 2.3–15.9)
RBC.: 3.52 MIL/uL — ABNORMAL LOW (ref 3.87–5.11)
Retic Count, Absolute: 53.5 10*3/uL (ref 19.0–186.0)
Retic Ct Pct: 1.5 % (ref 0.4–3.1)

## 2019-12-21 LAB — CBC
HCT: 29.6 % — ABNORMAL LOW (ref 36.0–46.0)
Hemoglobin: 9.5 g/dL — ABNORMAL LOW (ref 12.0–15.0)
MCH: 26.4 pg (ref 26.0–34.0)
MCHC: 32.1 g/dL (ref 30.0–36.0)
MCV: 82.2 fL (ref 80.0–100.0)
Platelets: 206 10*3/uL (ref 150–400)
RBC: 3.6 MIL/uL — ABNORMAL LOW (ref 3.87–5.11)
RDW: 14.8 % (ref 11.5–15.5)
WBC: 7.1 10*3/uL (ref 4.0–10.5)
nRBC: 0 % (ref 0.0–0.2)

## 2019-12-21 LAB — COMPREHENSIVE METABOLIC PANEL
ALT: 10 U/L (ref 0–44)
AST: 10 U/L — ABNORMAL LOW (ref 15–41)
Albumin: 2.4 g/dL — ABNORMAL LOW (ref 3.5–5.0)
Alkaline Phosphatase: 53 U/L (ref 38–126)
Anion gap: 8 (ref 5–15)
BUN: 5 mg/dL — ABNORMAL LOW (ref 6–20)
CO2: 23 mmol/L (ref 22–32)
Calcium: 8.6 mg/dL — ABNORMAL LOW (ref 8.9–10.3)
Chloride: 104 mmol/L (ref 98–111)
Creatinine, Ser: 0.64 mg/dL (ref 0.44–1.00)
GFR calc Af Amer: >60 mL/min (ref >60)
GFR calc non Af Amer: >60 mL/min (ref >60)
Glucose, Bld: 89 mg/dL (ref 70–99)
Potassium: 3.5 mmol/L (ref 3.5–5.1)
Sodium: 135 mmol/L (ref 135–145)
Total Bilirubin: 0.4 mg/dL (ref 0.3–1.2)
Total Protein: 5.5 g/dL — ABNORMAL LOW (ref 6.5–8.1)

## 2019-12-21 LAB — C-REACTIVE PROTEIN: CRP: 1 mg/dL — ABNORMAL HIGH (ref <1.0)

## 2019-12-21 LAB — PROCALCITONIN: Procalcitonin: <0.1 ng/mL

## 2019-12-21 MED ORDER — LACTATED RINGERS IV SOLN: INTRAVENOUS | Status: DC

## 2019-12-21 MED ORDER — DIPHENHYDRAMINE HCL 12.5 MG/5ML PO ELIX: 12.5000 mg | ORAL_SOLUTION | Freq: Four times a day (QID) | ORAL | Status: DC | PRN

## 2019-12-21 MED ORDER — OXYCODONE HCL ER 15 MG PO T12A
15.0000 mg | EXTENDED_RELEASE_TABLET | Freq: Two times a day (BID) | ORAL | Status: DC
  Filled 2019-12-21: qty 1

## 2019-12-21 MED ORDER — DIPHENHYDRAMINE HCL 50 MG/ML IJ SOLN
25.0000 mg | Freq: Once | INTRAMUSCULAR | Status: AC
  Administered 2019-12-21: 25 mg via INTRAVENOUS
  Filled 2019-12-21: qty 1

## 2019-12-21 MED ORDER — METOCLOPRAMIDE HCL 5 MG/ML IJ SOLN
10.0000 mg | Freq: Four times a day (QID) | INTRAMUSCULAR | Status: DC
  Administered 2019-12-21 – 2019-12-22 (×5): 10 mg via INTRAVENOUS
  Filled 2019-12-21 (×5): qty 2

## 2019-12-21 MED ORDER — PROMETHAZINE HCL 25 MG/ML IJ SOLN
25.0000 mg | Freq: Four times a day (QID) | INTRAMUSCULAR | Status: DC | PRN
  Administered 2019-12-21: 25 mg via INTRAVENOUS
  Filled 2019-12-21: qty 1

## 2019-12-21 MED ORDER — SODIUM CHLORIDE 0.9% FLUSH: 9.0000 mL | INTRAVENOUS | Status: DC | PRN

## 2019-12-21 MED ORDER — HYDROMORPHONE 1 MG/ML IV SOLN
INTRAVENOUS | Status: DC
  Administered 2019-12-21: 30 mg via INTRAVENOUS
  Filled 2019-12-21: qty 30

## 2019-12-21 MED ORDER — HYDROXYZINE HCL 50 MG PO TABS
50.0000 mg | ORAL_TABLET | Freq: Once | ORAL | Status: AC
  Administered 2019-12-21: 50 mg via ORAL
  Filled 2019-12-21: qty 1

## 2019-12-21 MED ORDER — ONDANSETRON HCL 4 MG/2ML IJ SOLN
4.0000 mg | INTRAMUSCULAR | Status: DC | PRN
  Administered 2019-12-21 (×2): 4 mg via INTRAVENOUS
  Filled 2019-12-21 (×2): qty 2

## 2019-12-21 MED ORDER — OXYCODONE-ACETAMINOPHEN 5-325 MG PO TABS
1.0000 | ORAL_TABLET | Freq: Four times a day (QID) | ORAL | Status: DC | PRN
  Administered 2019-12-21: 1 via ORAL
  Filled 2019-12-21: qty 1

## 2019-12-21 MED ORDER — SODIUM CHLORIDE 0.9 % IV SOLN: 25.0000 mg | Freq: Four times a day (QID) | INTRAVENOUS | Status: DC | PRN

## 2019-12-21 MED ORDER — OXYCODONE HCL ER 10 MG PO T12A: 10.0000 mg | EXTENDED_RELEASE_TABLET | Freq: Two times a day (BID) | ORAL | Status: DC

## 2019-12-21 MED ORDER — PROMETHAZINE HCL 25 MG/ML IJ SOLN: 12.5000 mg | Freq: Four times a day (QID) | INTRAMUSCULAR | Status: DC | PRN

## 2019-12-21 MED ORDER — NALOXONE HCL 0.4 MG/ML IJ SOLN: 0.4000 mg | INTRAMUSCULAR | Status: DC | PRN

## 2019-12-21 MED ORDER — LEVETIRACETAM 500 MG PO TABS
500.0000 mg | ORAL_TABLET | Freq: Two times a day (BID) | ORAL | Status: DC
  Administered 2019-12-21 – 2019-12-22 (×2): 500 mg via ORAL
  Filled 2019-12-21 (×3): qty 1

## 2019-12-21 MED ORDER — METOPROLOL SUCCINATE ER 25 MG PO TB24
25.0000 mg | ORAL_TABLET | Freq: Every day | ORAL | Status: DC
  Administered 2019-12-21: 25 mg via ORAL
  Filled 2019-12-21 (×2): qty 1

## 2019-12-21 NOTE — Progress Notes (Signed)
Pt was placed on monitor at 1645 and requested to come off at 1701 due to itching. Pt states she wants to do the monitor again once her itching is relieved.

## 2019-12-21 NOTE — Progress Notes (Signed)
FACULTY PRACTICE ANTEPARTUM COMPREHENSIVE PROGRESS NOTE  Kimberly Benitez is a 29 y.o. [id number] at [redacted]w[redacted]d who is admitted for sickle cell chronic pain.  Estimated Date of Delivery: 04/02/20 Fetal presentation is transverse.  Length of Stay:  0 Days. Admitted 12/20/2019  Subjective: Pt w/ continued N/V overnight despite zofran. Pain minimally controled 6-7 overnight.  Patient reports good fetal movement.  She reports no uterine contractions, no bleeding and no loss of fluid per vagina.  Vitals:  Blood pressure 126/68, pulse 88, temperature 97.8 F (36.6 C), temperature source Oral, resp. rate 16, height 6' (1.829 m), weight 114.3 kg, SpO2 98 %. Physical Examination: CONSTITUTIONAL: Well-developed, well-nourished female in no acute distress.  HENT:  Normocephalic, atraumatic, External right and left ear normal. Oropharynx is clear and moist EYES: Conjunctivae and EOM are normal. Pupils are equal, round, and reactive to light. No scleral icterus.  NECK: Normal range of motion, supple, no masses SKIN: Skin is warm and dry. No rash noted. Not diaphoretic. No erythema. No pallor. NEUROLGIC: Alert and oriented to person, place, and time. Normal reflexes, muscle tone coordination. No cranial nerve deficit noted. PSYCHIATRIC: Normal mood and affect. Normal behavior. Normal judgment and thought content. CARDIOVASCULAR: Normal heart rate noted, regular rhythm RESPIRATORY: Effort and breath sounds normal, no problems with respiration noted MUSCULOSKELETAL: Normal range of motion. No edema and no tenderness. 2+ distal pulses. ABDOMEN: Soft, nontender, nondistended, gravid. CERVIX:    Fetal monitoring: FHR: 150 bpm, Variability: moderate, Accelerations: Present, Decelerations: Absent  Uterine activity: 0 contractions per hour  Results for orders placed or performed during the hospital encounter of 12/20/19 (from the past 48 hour(s))  Type and screen [facility]     Status: None    Collection Time: 12/20/19  3:15 PM  Result Value Ref Range   ABO/RH(D) A POS    Antibody Screen NEG    Sample Expiration      12/23/2019,2359 Performed at [facility], [address], [place], [place] [address]   CBC with Differential/Platelet     Status: Abnormal   Collection Time: 12/20/19  3:28 PM  Result Value Ref Range   WBC 6.4 4.0 - 10.5 K/uL   RBC 3.85 (L) 3.87 - 5.11 MIL/uL   Hemoglobin 10.0 (L) 12.0 - 15.0 g/dL   HCT 62.1 (L) 36 - 46 %   MCV 82.1 80.0 - 100.0 fL   MCH 26.0 26.0 - 34.0 pg   MCHC 31.6 30.0 - 36.0 g/dL   RDW 30.8 65.7 - 84.6 %   Platelets 214 150 - 400 K/uL   nRBC 0.0 0.0 - 0.2 %   Neutrophils Relative % 64 %   Neutro Abs 4.1 1.7 - 7.7 K/uL   Lymphocytes Relative 30 %   Lymphs Abs 1.9 0.7 - 4.0 K/uL   Monocytes Relative 5 %   Monocytes Absolute 0.3 0 - 1 K/uL   Eosinophils Relative 1 %   Eosinophils Absolute 0.1 0 - 0 K/uL   Basophils Relative 0 %   Basophils Absolute 0.0 0 - 0 K/uL   Immature Granulocytes 0 %   Abs Immature Granulocytes 0.02 0.00 - 0.07 K/uL    Comment: Performed at [facility] Lab, [address], [place], [place] [address]  Comprehensive metabolic panel     Status: Abnormal   Collection Time: 12/20/19  3:28 PM  Result Value Ref Range   Sodium 138 135 - 145 mmol/L   Potassium 3.7 3.5 - 5.1 mmol/L  Chloride 108 98 - 111 mmol/L   CO2 22 22 - 32 mmol/L   Glucose, Bld 93 70 - 99 mg/dL    Comment: Glucose reference range applies only to samples taken after fasting for at least 8 hours.   BUN 5 (L) 6 - 20 mg/dL   Creatinine, Ser 0.01 0.44 - 1.00 mg/dL   Calcium 8.6 (L) 8.9 - 10.3 mg/dL   Total Protein 6.2 (L) 6.5 - 8.1 g/dL   Albumin 2.6 (L) 3.5 - 5.0 g/dL   AST 11 (L) 15 - 41 U/L   ALT 11 0 - 44 U/L   Alkaline Phosphatase 59 38 - 126 U/L   Total Bilirubin 0.2 (L) 0.3 - 1.2 mg/dL   GFR calc non Af Amer >60 >60 mL/min   GFR calc Af Amer >60 >60 mL/min   Anion gap 8 5 - 15    Comment: Performed at [facility] Lab, [address], [place], [place] [address]  Reticulocytes     Status: Abnormal   Collection Time: 12/20/19  3:28 PM  Result Value Ref Range   Retic Ct Pct 1.7 0.4 - 3.1 %   RBC. 3.90 3.87 - 5.11 MIL/uL   Retic Count, Absolute 65.5 19.0 - 186.0 K/uL   Immature Retic Fract 19.2 (H) 2.3 - 15.9 %    Comment: Performed at [facility] Lab, [address] [place], [place] [address]  SARS Coronavirus 2 by RT PCR (hospital order, performed in [facility] hospital lab) Nasopharyngeal Nasopharyngeal Swab     Status: None   Collection Time: 12/20/19  7:31 PM   Specimen: Nasopharyngeal Swab  Result Value Ref Range   SARS Coronavirus 2 NEGATIVE NEGATIVE    Comment: (NOTE) SARS-CoV-2 target nucleic acids are NOT DETECTED.  The SARS-CoV-2 RNA is generally detectable in upper and lower respiratory specimens during the acute phase of infection. The lowest concentration of SARS-CoV-2 viral copies this assay can detect is 250 copies / mL. A negative result does not preclude SARS-CoV-2 infection and should not be used as the sole basis for treatment or other patient management decisions.  A negative result may occur with improper specimen collection / handling, submission of specimen other than nasopharyngeal swab, presence of viral mutation(s) within the areas targeted by this assay, and inadequate number of viral copies (<250 copies / mL). A negative result must be combined with clinical observations, patient history, and epidemiological information.  Fact Sheet for Patients:   BoilerBrush.com.cy  Fact Sheet for Healthcare Providers: https://pope.com/  This test is not yet approved or  cleared by the [place] FDA and has been authorized for detection and/or diagnosis of SARS-CoV-2 by FDA under an Emergency Use Authorization (EUA).  This EUA will remain in effect (meaning this test can be used) for the duration of the COVID-19  declaration under Section 564(b)(1) of the Act, 21 U.S.C. section 360bbb-3(b)(1), unless the authorization is terminated or revoked sooner.  Performed at [facility], [address], [place], [place] [address]   CBC     Status: Abnormal   Collection Time: 12/21/19  6:05 AM  Result Value Ref Range   WBC 7.1 4.0 - 10.5 K/uL   RBC 3.60 (L) 3.87 - 5.11 MIL/uL   Hemoglobin 9.5 (L) 12.0 - 15.0 g/dL   HCT 66.5 (L) 36 - 46 %   MCV 82.2 80.0 - 100.0 fL   MCH 26.4 26.0 - 34.0 pg   MCHC 32.1 30.0 -  36.0 g/dL   RDW 96.7 89.3 - 81.0 %   Platelets 206 150 - 400 K/uL   nRBC 0.0 0.0 - 0.2 %    Comment: Performed at [facility] Lab, [address] [place], [place] [address]  Reticulocytes     Status: Abnormal   Collection Time: 12/21/19  6:05 AM  Result Value Ref Range   Retic Ct Pct 1.5 0.4 - 3.1 %   RBC. 3.52 (L) 3.87 - 5.11 MIL/uL   Retic Count, Absolute 53.5 19.0 - 186.0 K/uL   Immature Retic Fract 18.5 (H) 2.3 - 15.9 %    Comment: Performed at [facility], [address] [place], [place] [address]  Comprehensive metabolic panel     Status: Abnormal   Collection Time: 12/21/19  6:05 AM  Result Value Ref Range   Sodium 135 135 - 145 mmol/L   Potassium 3.5 3.5 - 5.1 mmol/L   Chloride 104 98 - 111 mmol/L   CO2 23 22 - 32 mmol/L   Glucose, Bld 89 70 - 99 mg/dL    Comment: Glucose reference range applies only to samples taken after fasting for at least 8 hours.   BUN 5 (L) 6 - 20 mg/dL   Creatinine, Ser 7.78 0.44 - 1.00 mg/dL   Calcium 8.6 (L) 8.9 - 10.3 mg/dL   Total Protein 5.5 (L) 6.5 - 8.1 g/dL   Albumin 2.4 (L) 3.5 - 5.0 g/dL   AST 10 (L) 15 - 41 U/L   ALT 10 0 - 44 U/L   Alkaline Phosphatase 53 38 - 126 U/L   Total Bilirubin 0.4 0.3 - 1.2 mg/dL   GFR calc non Af Amer >60 >60 mL/min   GFR calc Af Amer >60 >60 mL/min   Anion gap 8 5 - 15    Comment: Performed at [facility] Lab, [address] [place], [place] [address]    DG CHEST PORT 1 VIEW  Result  Date: 12/20/2019 CLINICAL DATA:  Fall.  Patient is pregnant EXAM: PORTABLE CHEST 1 VIEW COMPARISON:  10/22/2019 FINDINGS: Prior median sternotomy. Heart size is mildly enlarged. No focal airspace consolidation, pleural effusion, or pneumothorax. Lower cervical ACDF. IMPRESSION: No active disease. Electronically Signed   By: Duanne Guess D.O.   On: 12/20/2019 16:03    Current scheduled medications . docusate sodium  100 mg Oral Daily  . enoxaparin  80 mg Subcutaneous Q24H  . ferrous sulfate  325 mg Oral TID WC  . gabapentin  300 mg Oral TID  . oxyCODONE  15 mg Oral Q12H  . prenatal multivitamin  1 tablet Oral Q1200  . QUEtiapine  800 mg Oral QHS    I have reviewed the patient's current medications.  ASSESSMENT: Active Problems:   Sickle cell anemia of mother during pregnancy [facility])   PLAN: Sickle cell: Sickle cell consult ordered for AM. Not in ACS, not in crisis. Started on long acting narcotic.  Marfans w/ hx of aortic root replacement: followed by [facility] H/o VTE: lovenox   Continue routine antenatal care.   Raynelle Dick, MD, FACOG Obstetrician & Gynecologist, [facility] for [facility], [facility]
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
## 2019-12-22 DIAGNOSIS — Z8673 Personal history of transient ischemic attack (TIA), and cerebral infarction without residual deficits: Secondary | ICD-10-CM | POA: Insufficient documentation

## 2019-12-22 DIAGNOSIS — O99891 Other specified diseases and conditions complicating pregnancy: Secondary | ICD-10-CM

## 2019-12-22 DIAGNOSIS — Z3A25 25 weeks gestation of pregnancy: Secondary | ICD-10-CM

## 2019-12-22 DIAGNOSIS — Q8740 Marfan's syndrome, unspecified: Secondary | ICD-10-CM

## 2019-12-22 LAB — PROCALCITONIN: Procalcitonin: <0.1 ng/mL

## 2019-12-22 NOTE — Plan of Care (Signed)
Pt to be discharged with printed instructions.  Kimberly Benitez L [name]

## 2019-12-22 NOTE — Discharge Instructions (Signed)
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
## 2019-12-24 LAB — HGB FRACTIONATION CASCADE
Hgb A2: 2.7 % (ref 1.8–3.2)
Hgb A: 96.9 % (ref 96.4–98.8)
Hgb F: 0.4 % (ref 0.0–2.0)
Hgb S: 0 %

## 2019-12-27 ENCOUNTER — Encounter: Payer: Self-pay | Admitting: Obstetrics and Gynecology

## 2019-12-27 DIAGNOSIS — D571 Sickle-cell disease without crisis: Secondary | ICD-10-CM | POA: Insufficient documentation

## 2020-03-05 ENCOUNTER — Inpatient Hospital Stay (HOSPITAL_COMMUNITY)
Admission: AD | Admit: 2020-03-05 | Discharge: 2020-03-05 | Disposition: A | Discharge status: Home / Self Care | Payer: Medicaid Other | Attending: Family Medicine | Admitting: Family Medicine

## 2020-03-05 ENCOUNTER — Other Ambulatory Visit: Payer: Self-pay

## 2020-03-05 DIAGNOSIS — O479 False labor, unspecified: Secondary | ICD-10-CM | POA: Diagnosis not present

## 2020-03-05 DIAGNOSIS — Z3A36 36 weeks gestation of pregnancy: Secondary | ICD-10-CM | POA: Diagnosis not present

## 2020-03-05 NOTE — MAU Provider Note (Signed)
Kimberly Benitez is a [id number] at [redacted]w[redacted]d seen in MAU for labor. RN labor check, not seen by provider.   SVE by RN Dilation: 2 Effacement (%): Thick Cervical Position: Posterior Station: -3 Exam by:: Zenia Resides, RN   NST - FHR: 145 bpm / moderate variability / accels present / decels absent NST reactive / TOCO: irregular   Plan:  D/C home with labor precautions Keep scheduled appt with prenatal provider at [facility], CNM  03/05/2020 8:17 PM

## 2020-03-05 NOTE — MAU Note (Signed)
Pt arrives via EMS, c/o ctx every 10 min, lasting approx 1-2 minutes.  Denies any vaginal bleeding or discharge, +FM.

## 2020-03-05 NOTE — Discharge Instructions (Signed)
Reasons to return to MAU at [facility]:  1.  Contractions are  5 minutes apart or less, each last 1 minute, these have been going on for 1-2 hours, and you cannot walk or talk during them 2.  You have a large gush of fluid, or a trickle of fluid that will not stop and you have to wear a pad 3.  You have bleeding that is bright red, heavier than spotting--like menstrual bleeding (spotting can be normal in early labor or after a check of your cervix) 4.  You do not feel the baby moving like he/she normally does  

## 2020-03-08 ENCOUNTER — Emergency Department (HOSPITAL_COMMUNITY)
Admission: EM | Admit: 2020-03-08 | Discharge: 2020-03-09 | Disposition: A | Discharge status: Home / Self Care | Payer: Medicaid Other | Attending: Emergency Medicine | Admitting: Emergency Medicine

## 2020-03-08 ENCOUNTER — Encounter (HOSPITAL_COMMUNITY): Payer: Self-pay

## 2020-03-08 ENCOUNTER — Other Ambulatory Visit: Payer: Self-pay

## 2020-03-08 DIAGNOSIS — O26893 Other specified pregnancy related conditions, third trimester: Secondary | ICD-10-CM | POA: Diagnosis present

## 2020-03-08 DIAGNOSIS — E876 Hypokalemia: Secondary | ICD-10-CM

## 2020-03-08 DIAGNOSIS — O368390 Maternal care for abnormalities of the fetal heart rate or rhythm, unspecified trimester, not applicable or unspecified: Secondary | ICD-10-CM

## 2020-03-08 DIAGNOSIS — Z79899 Other long term (current) drug therapy: Secondary | ICD-10-CM | POA: Diagnosis not present

## 2020-03-08 DIAGNOSIS — R0602 Shortness of breath: Secondary | ICD-10-CM | POA: Diagnosis not present

## 2020-03-08 DIAGNOSIS — R002 Palpitations: Secondary | ICD-10-CM | POA: Diagnosis not present

## 2020-03-08 DIAGNOSIS — R0789 Other chest pain: Secondary | ICD-10-CM

## 2020-03-08 DIAGNOSIS — Z91010 Allergy to peanuts: Secondary | ICD-10-CM | POA: Insufficient documentation

## 2020-03-08 DIAGNOSIS — Z87891 Personal history of nicotine dependence: Secondary | ICD-10-CM | POA: Diagnosis not present

## 2020-03-08 DIAGNOSIS — Z3A36 36 weeks gestation of pregnancy: Secondary | ICD-10-CM | POA: Diagnosis not present

## 2020-03-08 DIAGNOSIS — D649 Anemia, unspecified: Secondary | ICD-10-CM | POA: Insufficient documentation

## 2020-03-08 DIAGNOSIS — G8929 Other chronic pain: Secondary | ICD-10-CM | POA: Diagnosis not present

## 2020-03-08 DIAGNOSIS — I509 Heart failure, unspecified: Secondary | ICD-10-CM | POA: Insufficient documentation

## 2020-03-08 DIAGNOSIS — Z3493 Encounter for supervision of normal pregnancy, unspecified, third trimester: Secondary | ICD-10-CM

## 2020-03-08 LAB — CBC WITH DIFFERENTIAL/PLATELET
Abs Immature Granulocytes: 0.01 10*3/uL (ref 0.00–0.07)
Basophils Absolute: 0 10*3/uL (ref 0.0–0.1)
Basophils Relative: 0 %
Eosinophils Absolute: 0 10*3/uL (ref 0.0–0.5)
Eosinophils Relative: 0 %
HCT: 31.6 % — ABNORMAL LOW (ref 36.0–46.0)
Hemoglobin: 9.7 g/dL — ABNORMAL LOW (ref 12.0–15.0)
Immature Granulocytes: 0 %
Lymphocytes Relative: 45 %
Lymphs Abs: 2.8 10*3/uL (ref 0.7–4.0)
MCH: 25.8 pg — ABNORMAL LOW (ref 26.0–34.0)
MCHC: 30.7 g/dL (ref 30.0–36.0)
MCV: 84 fL (ref 80.0–100.0)
Monocytes Absolute: 0.4 10*3/uL (ref 0.1–1.0)
Monocytes Relative: 7 %
Neutro Abs: 3 10*3/uL (ref 1.7–7.7)
Neutrophils Relative %: 48 %
Platelets: 159 10*3/uL (ref 150–400)
RBC: 3.76 MIL/uL — ABNORMAL LOW (ref 3.87–5.11)
RDW: 15.6 % — ABNORMAL HIGH (ref 11.5–15.5)
WBC: 6.2 10*3/uL (ref 4.0–10.5)
nRBC: 0 % (ref 0.0–0.2)

## 2020-03-08 NOTE — ED Notes (Signed)
Rapid response at bedside.

## 2020-03-08 NOTE — ED Provider Notes (Signed)
[facility] EMERGENCY DEPARTMENT Provider Note   CSN: [id number] Arrival date & time: 03/08/20  2244   History Chief Complaint  Patient presents with  . Chest Pain  . Atrial Fibrillation  . Abdominal Cramping    Kimberly Benitez is a 29 y.o. female.  The history is provided by the patient.  Chest Pain Atrial Fibrillation Associated symptoms include chest pain.  Abdominal Cramping Associated symptoms include chest pain.  She has history of Marfan syndrome with aortic aneurysm, alpha thalassemia, sickle cell anemia, chronic pain, paroxysmal atrial fibrillation and states that she had an episode of atrial fibrillation tonight.  It started about 30 minutes before she called for the ambulance, but she spontaneously converted in route to the hospital.  She is complaining of some midsternal chest pain with some shortness of breath.  This is a chronic pain for her and she has been referred to a pain management clinic.  She denies nausea, vomiting, diaphoresis.  She is also 36-1/[redacted] weeks gestation and is scheduled to be admitted at [facility] in 3 days for elective cesarean birth.  She states that she has felt fetal movement.  She denies any vaginal bleeding or bloody show.  Past Medical History:  Diagnosis Date  . Alpha thalassemia (HCC)   . Aortic aneurysm (HCC)   . Blood transfusion without reported diagnosis   . CHF (congestive heart failure) (HCC)   . History of aortic aneurysm 10/25/2019  . Marfan syndrome   . Protein C deficiency (HCC)   . Seizures (HCC)   . Sickle cell anemia [facility])     Patient Active Problem List   Diagnosis Date Noted  . History of CVA (cerebrovascular accident) 12/22/2019  . Chronic pain syndrome   . Protein C deficiency (HCC) 10/25/2019  . Marfan syndrome 10/25/2019  . History of aortic aneurysm 10/25/2019    Past Surgical History:  Procedure Laterality Date  . EXPLORATION POST OPERATIVE OPEN HEART      . FOOT SURGERY    . NECK SURGERY       OB History    Gravida  5   Para  2   Term  2   Preterm      AB  2   Living  2     SAB  2   TAB      Ectopic      Multiple      Live Births              Family History  Problem Relation Age of Onset  . Diabetes Mother   . Cancer Father     Social History   Tobacco Use  . Smoking status: Former Games developer  . Smokeless tobacco: Former Clinical biochemist  . Vaping Use: Former  Substance Use Topics  . Alcohol use: Not Currently  . Drug use: Not Currently    Home Medications Prior to Admission medications   Medication Sig Start Date End Date Taking? Authorizing Provider  carvedilol (COREG) 25 MG tablet Take 2 tablets (50 mg total) by mouth 2 (two) times daily with a meal. 10/22/19   Fayrene Helper, PA-C  cholecalciferol (VITAMIN D3) 25 MCG (1000 UNIT) tablet Take 1,000 Units by mouth daily.    [provider]  enoxaparin (LOVENOX) 80 MG/0.8ML injection Inject 0.8 mLs (80 mg total) into the skin daily. 10/22/19   Fayrene Helper, PA-C  ferrous sulfate 325 (65 FE) MG tablet Take 325 mg by  mouth 3 (three) times daily with meals.    [provider]  gabapentin (NEURONTIN) 300 MG capsule Take 300 mg by mouth 3 (three) times daily.    [provider]  levETIRAcetam (KEPPRA) 500 MG tablet Take 500 mg by mouth 2 (two) times daily.    [provider]  metoprolol succinate (TOPROL-XL) 25 MG 24 hr tablet Take 25 mg by mouth at bedtime.    [provider]  metroNIDAZOLE (FLAGYL) 500 MG tablet Take 1 tablet (500 mg total) by mouth 2 (two) times daily. One po bid x 7 days 10/22/19   Fayrene Helper, PA-C  Prenatal Vit-Fe Fumarate-FA (PRENATAL MULTIVITAMIN) TABS tablet Take 1 tablet by mouth daily at 12 noon.    [provider]  QUEtiapine (SEROQUEL XR) 400 MG 24 hr tablet Take by mouth at bedtime.    [provider]  QUEtiapine (SEROQUEL) 100 MG tablet Take 200 mg by mouth at bedtime.  Taking with 300mg  = 500mg  at bedttime    [provider]  QUEtiapine (SEROQUEL) 300 MG tablet Take 1 tablet (300 mg total) by mouth at bedtime. Taking with 2 (100mg ) tabs = 500mg  at bedtime. 10/22/19   , PA-C    Allergies    Peanut-containing drug products  Review of Systems   Review of Systems  Cardiovascular: Positive for chest pain.  All other systems reviewed and are negative.   Physical Exam Updated Vital Signs BP 108/77 (BP Location: Left Arm)   Pulse (!) 105   Temp 97.7 F (36.5 C) (Oral)   Resp (!) 27   SpO2 99%   Physical Exam Vitals and nursing note reviewed.   29 year old female, resting comfortably and in no acute distress. Vital signs are significant for elevated respiratory rate.  Mild tachycardia is present but within the normal range for third trimester pregnancy. Oxygen saturation is 99%, which is normal. Head is normocephalic and atraumatic. PERRLA, EOMI. Oropharynx is clear. Neck is nontender and supple without adenopathy or JVD. Back is nontender and there is no CVA tenderness. Lungs are clear without rales, wheezes, or rhonchi. Chest is nontender. Heart has regular rate and rhythm without murmur. Abdomen is soft, nontender without masses or hepatosplenomegaly and peristalsis is normoactive.  Gravid uterus present which is nontender, size consistent with dates. Extremities have no cyanosis or edema, full range of motion is present. Skin is warm and dry without rash. Neurologic: Mental status is normal, cranial nerves are intact, there are no motor or sensory deficits.   ED Results / Procedures / Treatments   Labs (all labs ordered are listed, but only abnormal results are displayed) Labs Reviewed  CBC WITH DIFFERENTIAL/PLATELET - Abnormal; Notable for the following components:      Result Value   RBC 3.76 (*)    Hemoglobin 9.7 (*)    HCT 31.6 (*)    MCH 25.8 (*)    RDW 15.6 (*)    All other components within normal limits  BASIC  METABOLIC PANEL  TROPONIN I (HIGH SENSITIVITY)    EKG EKG Interpretation  Date/Time:  Saturday March 08 2020 22:52:12 EST Ventricular Rate:  100 PR Interval:    QRS Duration: 93 QT Interval:  377 QTC Calculation: 487 R Axis:   44 Text Interpretation: Sinus tachycardia Borderline prolonged QT interval When compared with ECG of 12/20/2019, QT has lengthened Confirmed by 26 (Monday) on 03/08/2020 10:58:19 PM  Procedures Procedures  Medications Ordered in ED Medications - No data to  display  ED Course  I have reviewed the triage vital signs and the nursing notes.  Pertinent labs & imaging results that were available during my care of the patient were reviewed by me and considered in my medical decision making (see chart for details).  MDM Rules/Calculators/A&P Term pregnancy with apparent episode of paroxysmal atrial fibrillation but not documented.  Old records are reviewed, and she had a positive COVID-19 test on 11/1.  She is scheduled for elective cesarean birth on 11/18.  ECG shows sinus rhythm.  Will check screening labs.  12:16 AM CBC shows anemia which is unchanged from baseline.  OB rapid response nurse notes some possible fetal distress on fetal monitoring.  Patient is transferred to maternal admissions unit for ongoing fetal monitoring.  1:02 AM Troponin is normal.  Metabolic panel shows mild hypokalemia as well as mild metabolic acidosis with normal anion gap.  She is given a dose of oral potassium.  Final Clinical Impression(s) / ED Diagnoses Final diagnoses:  Palpitations  Chest discomfort  Third trimester pregnancy  Normochromic normocytic anemia    Rx / DC Orders ED Discharge Orders    None       Dione Booze, MD 03/09/20 [phone]

## 2020-03-08 NOTE — ED Triage Notes (Signed)
Per [facility] , pt coming from home reporting chest pain and chest flutter, abd cramps and reports mucus plug fallen out, states able to calm down HR with breathing, HR 150-200. Hx AFIB, and cardioversion, aneurysm, [redacted] weeks pregnant now.  324MG  asa given 500 ml ns , 18g in left hand. spo2 97%, bp 128/72, hr 150-200, cbg 149

## 2020-03-08 NOTE — ED Notes (Signed)
rapid response called for fetal heart monitoring

## 2020-03-09 ENCOUNTER — Emergency Department (HOSPITAL_BASED_OUTPATIENT_CLINIC_OR_DEPARTMENT_OTHER): Payer: Medicaid Other

## 2020-03-09 DIAGNOSIS — R002 Palpitations: Secondary | ICD-10-CM | POA: Diagnosis not present

## 2020-03-09 DIAGNOSIS — O368330 Maternal care for abnormalities of the fetal heart rate or rhythm, third trimester, not applicable or unspecified: Secondary | ICD-10-CM

## 2020-03-09 DIAGNOSIS — I509 Heart failure, unspecified: Secondary | ICD-10-CM | POA: Diagnosis not present

## 2020-03-09 DIAGNOSIS — Z79899 Other long term (current) drug therapy: Secondary | ICD-10-CM | POA: Diagnosis not present

## 2020-03-09 DIAGNOSIS — R0789 Other chest pain: Secondary | ICD-10-CM | POA: Diagnosis not present

## 2020-03-09 DIAGNOSIS — O26893 Other specified pregnancy related conditions, third trimester: Secondary | ICD-10-CM | POA: Diagnosis present

## 2020-03-09 DIAGNOSIS — Z87891 Personal history of nicotine dependence: Secondary | ICD-10-CM | POA: Diagnosis not present

## 2020-03-09 DIAGNOSIS — D649 Anemia, unspecified: Secondary | ICD-10-CM | POA: Diagnosis not present

## 2020-03-09 DIAGNOSIS — Z3A36 36 weeks gestation of pregnancy: Secondary | ICD-10-CM

## 2020-03-09 DIAGNOSIS — Z91010 Allergy to peanuts: Secondary | ICD-10-CM | POA: Diagnosis not present

## 2020-03-09 DIAGNOSIS — G8929 Other chronic pain: Secondary | ICD-10-CM | POA: Diagnosis not present

## 2020-03-09 DIAGNOSIS — R0602 Shortness of breath: Secondary | ICD-10-CM | POA: Diagnosis not present

## 2020-03-09 LAB — BASIC METABOLIC PANEL
Anion gap: 12 (ref 5–15)
BUN: 5 mg/dL — ABNORMAL LOW (ref 6–20)
CO2: 17 mmol/L — ABNORMAL LOW (ref 22–32)
Calcium: 8.4 mg/dL — ABNORMAL LOW (ref 8.9–10.3)
Chloride: 107 mmol/L (ref 98–111)
Creatinine, Ser: 0.63 mg/dL (ref 0.44–1.00)
GFR, Estimated: >60 mL/min (ref >60)
Glucose, Bld: 101 mg/dL — ABNORMAL HIGH (ref 70–99)
Potassium: 3.4 mmol/L — ABNORMAL LOW (ref 3.5–5.1)
Sodium: 136 mmol/L (ref 135–145)

## 2020-03-09 LAB — TROPONIN I (HIGH SENSITIVITY)
Troponin I (High Sensitivity): 10 ng/L (ref <18)
Troponin I (High Sensitivity): 15 ng/L (ref <18)

## 2020-03-09 MED ORDER — POTASSIUM CHLORIDE CRYS ER 20 MEQ PO TBCR: 40.0000 meq | EXTENDED_RELEASE_TABLET | Freq: Once | ORAL | Status: DC

## 2020-03-09 MED ORDER — POTASSIUM CHLORIDE 10 MEQ/100ML IV SOLN
10.0000 meq | Freq: Once | INTRAVENOUS | Status: AC
  Administered 2020-03-09: 10 meq via INTRAVENOUS
  Filled 2020-03-09: qty 100

## 2020-03-09 NOTE — ED Notes (Signed)
Report given to [facility] charge nurse christian jones [phone]

## 2020-03-09 NOTE — Progress Notes (Signed)
OB Note Fetus category II with normal baseline, no accels or decels but with minimal variability, no contractions. BPP ordered and prelim shows 4/8 (+2 for fluid and tone). Patient has been medically cleared by the ED.   CBC Latest Ref Rng & Units 03/08/2020 12/21/2019 12/20/2019  WBC 4.0 - 10.5 K/uL 6.2 7.1 6.4  Hemoglobin 12.0 - 15.0 g/dL 4.1(P) 3.7(T) 10.0(L)  Hematocrit 36 - 46 % 31.6(L) 29.6(L) 31.6(L)  Platelets 150 - 400 K/uL 159 206 214   Troponin negative (10)  BMP Latest Ref Rng & Units 03/08/2020 12/21/2019 12/20/2019  Glucose 70 - 99 mg/dL 024(O) 89 93  BUN 6 - 20 mg/dL 5(L) 5(L) 5(L)  Creatinine 0.44 - 1.00 mg/dL 9.73 5.32 9.92  Sodium 135 - 145 mmol/L 136 135 138  Potassium 3.5 - 5.1 mmol/L 3.4(L) 3.5 3.7  Chloride 98 - 111 mmol/L 107 104 108  CO2 22 - 32 mmol/L 17(L) 23 22  Calcium 8.9 - 10.3 mg/dL 4.2(A) 8.3(M) 1.9(Q)     Current Vital Signs 24h Vital Sign Ranges  T 97.7 F (36.5 C) Temp  Avg: 97.7 F (36.5 C)  Min: 97.7 F (36.5 C)  Max: 97.7 F (36.5 C)  BP 119/88 BP  Min: 105/77  Max: 119/88  HR 86 Pulse  Avg: 97.7  Min: 86  Max: 105  RR 19 Resp  Avg: 23.7  Min: 19  Max: 30  SaO2 98 % Room Air SpO2  Avg: 98.7 %  Min: 97 %  Max: 100 %       24 Hour I/O Current Shift I/O  Time Ins Outs No intake/output data recorded. No intake/output data recorded.    Patient Vitals for the past 24 hrs:  BP Temp Temp src Pulse Resp SpO2  03/09/20 0015 119/88 -- -- 86 19 98 %  03/08/20 2345 113/70 -- -- 97 (!) 26 100 %  03/08/20 2330 110/80 -- -- 97 (!) 30 100 %  03/08/20 2315 105/77 -- -- 98 20 97 %  03/08/20 2300 112/81 -- -- (!) 103 20 98 %  03/08/20 2256 108/77 97.7 F (36.5 C) Oral (!) 105 (!) 27 99 %    Given her having another episode of tachycardia (patient admitted at [facility] on 11/2-11/3 for this issue) that resolved with nitro (pt states she is taking her CV medications) and her BPP of 4/10, I am calling [facility] re: transferring patient to them since she may  need delivery. Pt was eating a steak and salad when episode happened (approx at 2100) and that was around when she took her lovenox.  Cornelia Copa MD [facility]ending [facility] [employer] (Faculty Practice) 03/09/2020 Time: 33  I spoke with MFM at [facility] (Drs. Council Mechanic and Grotegut) and we agreed that delivery is indicated and that if transport is available then best to transfer to [facility] for delivery as the delay in transport there would be similar to prep for OR here. If transport to be delayed for quite some time, then best to deliver at [facility].   ED charge nurse to call re: transport status.   Cornelia Copa MD [facility]ending [facility] [employer] (Faculty Practice) 03/09/2020 Time: 0215  EMS can be here in ten minutes. Fetus looks better and is category I due to moderate variability; still no accels or decels.   Cornelia Copa MD [facility] [employer] ([facility]ulty [facility]) 03/09/2020 Time: [phone]

## 2020-03-09 NOTE — ED Notes (Addendum)
Pt reports dr Hinton Lovely from [facility] as OB, 8TH pregnancy and only 2 living children.

## 2020-03-09 NOTE — ED Notes (Signed)
emtala and [facility] (MEDICAL NECESSITY FORM ), reviewed with lead charge nurse chris.
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
## 2020-10-26 IMAGING — DX DG CHEST 1V PORT
1 series · 1 of 1 positions shown · non-contrast
Comparison: 10/22/2019

CLINICAL DATA: Fall.  Patient is pregnant

EXAM:
PORTABLE CHEST 1 VIEW

[chest ap]
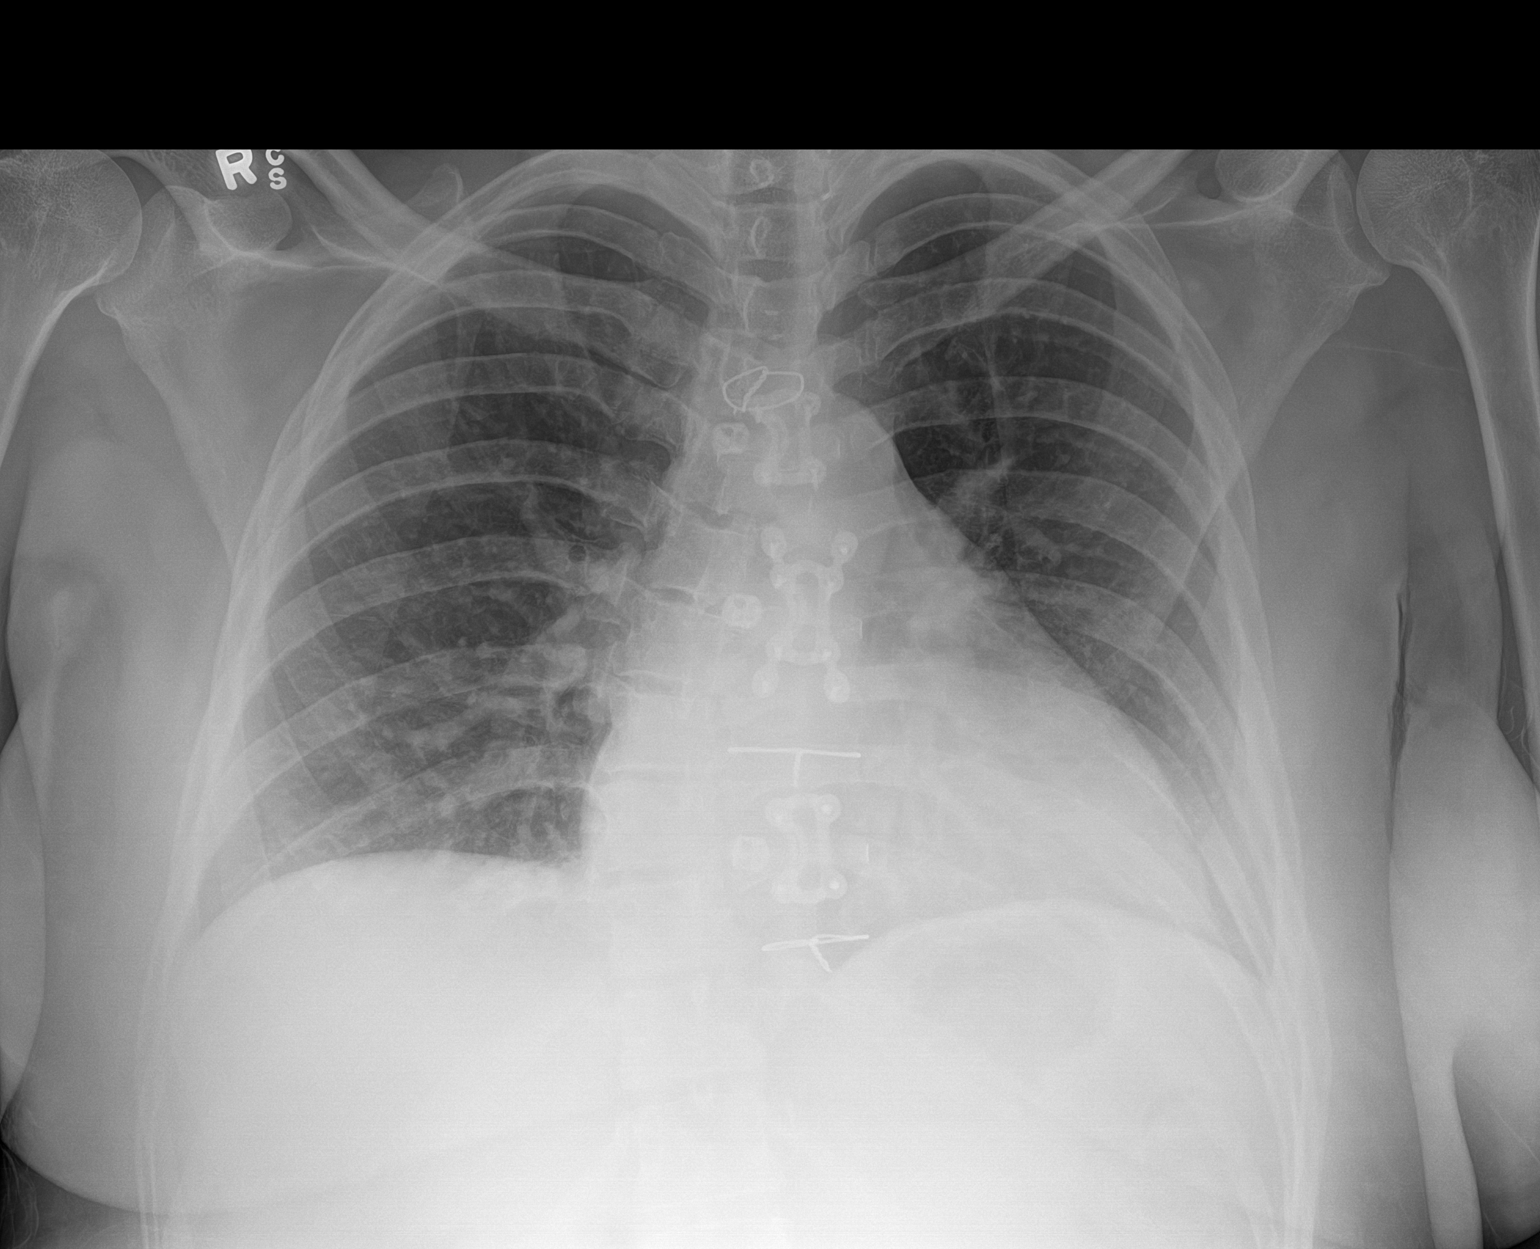

[1 of 1 positions shown; findings below may reference images not displayed]

FINDINGS: Prior median sternotomy. Heart size is mildly enlarged. No focal
airspace consolidation, pleural effusion, or pneumothorax. Lower
cervical ACDF.
IMPRESSION: No active disease.

## 2021-01-14 IMAGING — US US MFM FETAL BPP W/O NON-STRESS
1 series · 6 of 6 positions shown · non-contrast
Comparison: none

[Series 1: us mfm fetal bpp w/o non-stress · 6 acquisitions, 6 frames shown]
[im 1/6]
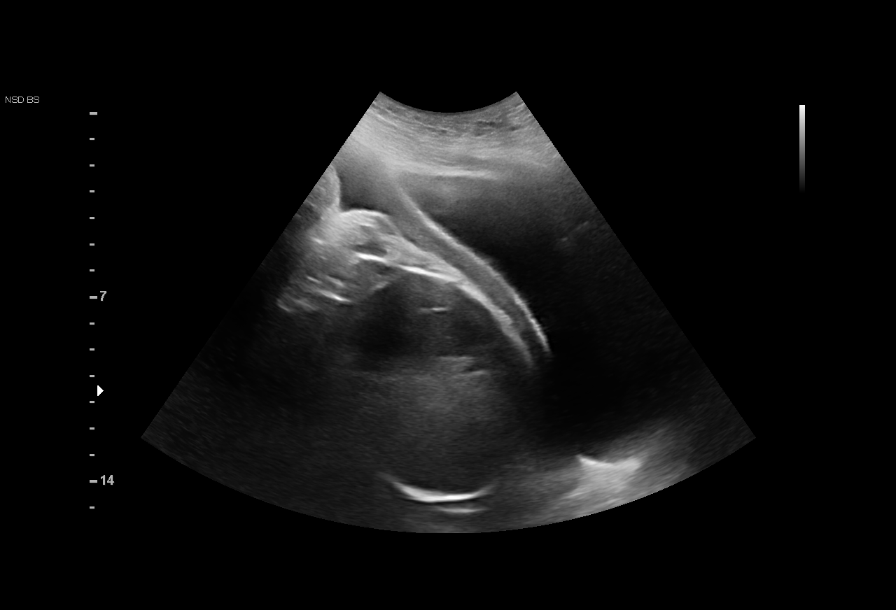
[im 2/6]
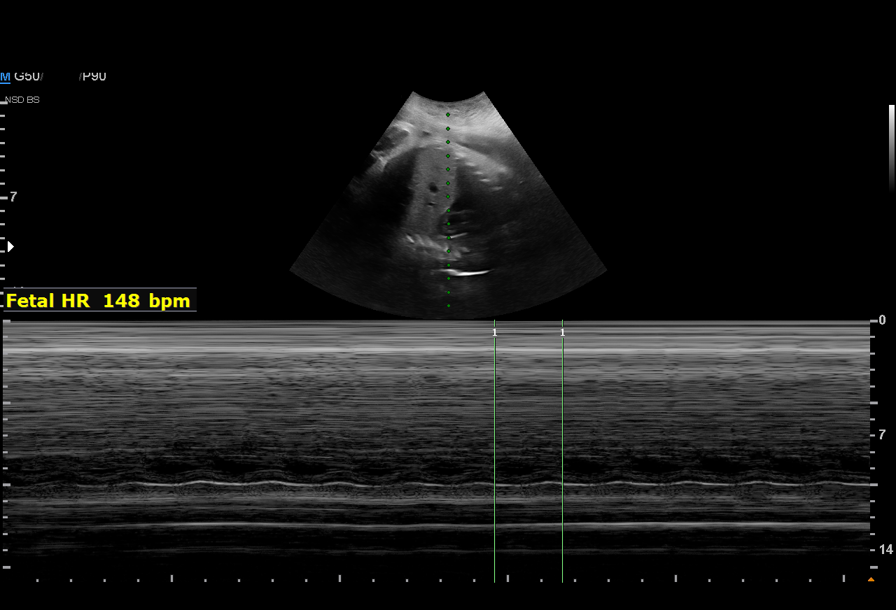
[im 3/6]
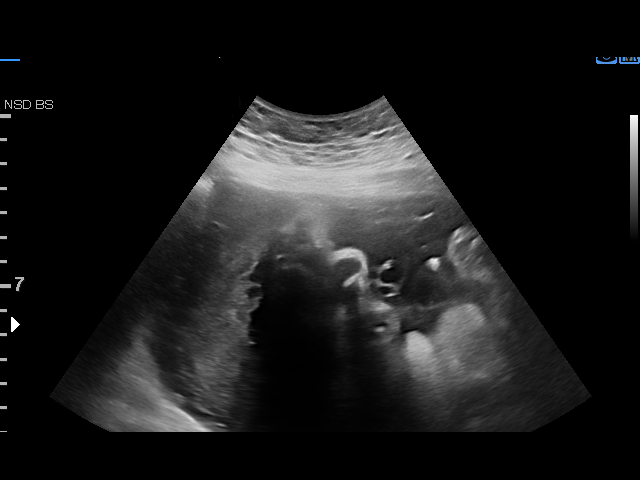
[im 4/6]
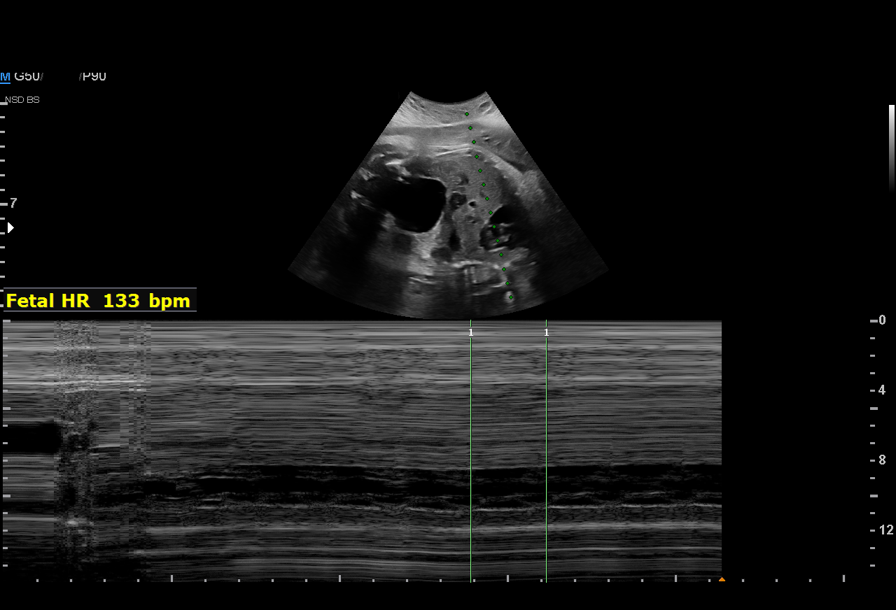
[im 5/6]
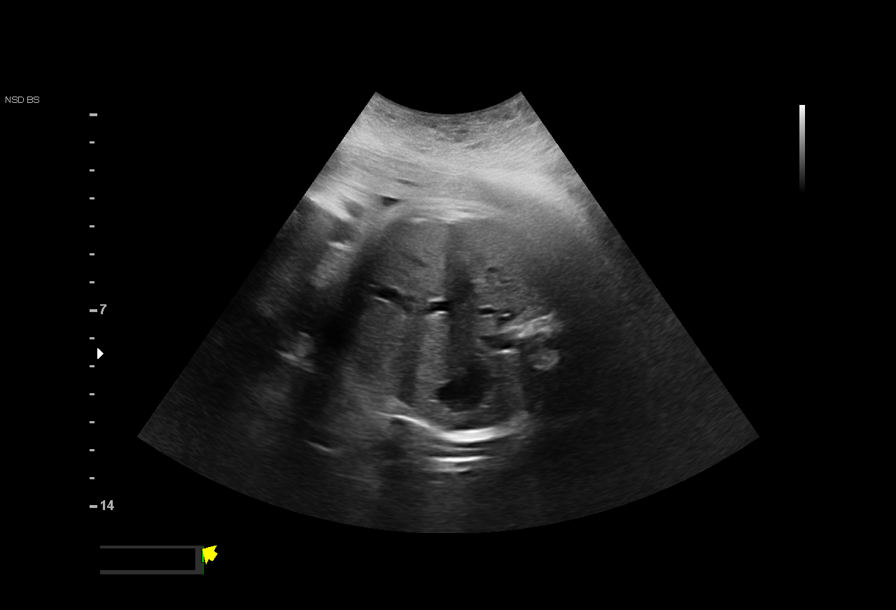
[im 6/6]
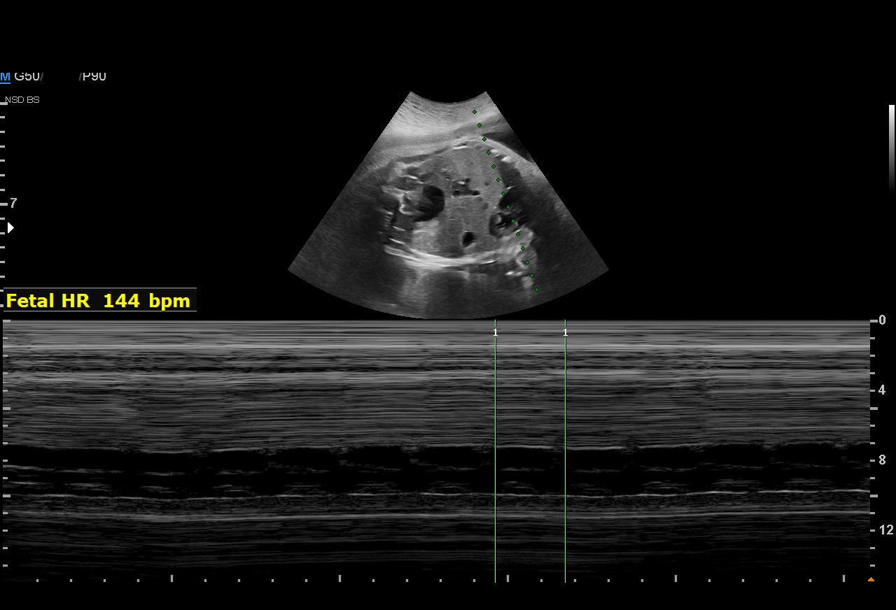

[6 of 6 positions shown; findings below may reference images not displayed]

[REDACTED]

Indications

 Absence of variability in fetal heart rate in
 third trimester
 36 weeks gestation of pregnancy
Fetal Evaluation

 Num Of Fetuses:          1
 Fetal Heart Rate(bpm):   148
 Cardiac Activity:        Observed
 Presentation:            Cephalic

 AFI Sum(cm)     %Tile       Largest Pocket(cm)
 13.4            48

 RUQ(cm)       RLQ(cm)       LUQ(cm)        LLQ(cm)

Biophysical Evaluation

 Amniotic F.V:   Within normal limits       F. Tone:         Observed
 F. Movement:    Not Observed               Score:           [DATE]
 F. Breathing:   Not Observed
OB History

 Gravidity:    5         Term:   2         SAB:   2
 Living:       2
Gestational Age

 Clinical EDD:  36w 4d                                        EDD:   04/02/20
 Best:          36w 4d     Det. By:  Clinical EDD             EDD:   04/02/20
Impression

 Antenatal testing
 Biophysical profile [DATE] with absent breathing and movement.
Recommendations

 Consider repeat BPP in 4-6 hours if NST reassuring.
 Management per in patient provider.
